# Patient Record
Sex: Female | Born: 1965 | Race: White | Hispanic: No | Marital: Married | Smoking: Never smoker
Health system: Southern US, Community
[De-identification: ages and names within clinical notes are randomized; demographics above are authoritative.]

## PROBLEM LIST (undated history)

## (undated) DIAGNOSIS — Z9889 Other specified postprocedural states: Secondary | ICD-10-CM

## (undated) DIAGNOSIS — Q431 Hirschsprung's disease: Secondary | ICD-10-CM

## (undated) DIAGNOSIS — Z87442 Personal history of urinary calculi: Secondary | ICD-10-CM

## (undated) DIAGNOSIS — M810 Age-related osteoporosis without current pathological fracture: Secondary | ICD-10-CM

## (undated) DIAGNOSIS — G709 Myoneural disorder, unspecified: Secondary | ICD-10-CM

## (undated) HISTORY — PX: SALPINGECTOMY: SHX328

## (undated) HISTORY — PX: SKIN GRAFT SPLIT THICKNESS LEG / FOOT: SUR1303

## (undated) HISTORY — PX: KNEE SURGERY: SHX244

## (undated) HISTORY — PX: MYOMECTOMY: SHX85

---

## 1997-04-18 ENCOUNTER — Other Ambulatory Visit: Admission: RE | Admit: 1997-04-18 | Discharge: 1997-04-18 | Payer: Self-pay | Admitting: Obstetrics and Gynecology

## 1997-07-07 ENCOUNTER — Other Ambulatory Visit: Admission: RE | Admit: 1997-07-07 | Discharge: 1997-07-07 | Payer: Self-pay | Admitting: Obstetrics and Gynecology

## 1997-11-21 ENCOUNTER — Inpatient Hospital Stay (HOSPITAL_COMMUNITY): Admission: RE | Admit: 1997-11-21 | Discharge: 1997-11-24 | Payer: Self-pay | Admitting: Obstetrics and Gynecology

## 1998-05-16 ENCOUNTER — Other Ambulatory Visit: Admission: RE | Admit: 1998-05-16 | Discharge: 1998-05-16 | Payer: Self-pay | Admitting: Obstetrics and Gynecology

## 1999-06-22 ENCOUNTER — Other Ambulatory Visit: Admission: RE | Admit: 1999-06-22 | Discharge: 1999-06-22 | Payer: Self-pay | Admitting: Obstetrics and Gynecology

## 2000-11-04 ENCOUNTER — Other Ambulatory Visit: Admission: RE | Admit: 2000-11-04 | Discharge: 2000-11-04 | Payer: Self-pay | Admitting: Obstetrics and Gynecology

## 2001-11-30 ENCOUNTER — Other Ambulatory Visit: Admission: RE | Admit: 2001-11-30 | Discharge: 2001-11-30 | Payer: Self-pay | Admitting: Obstetrics and Gynecology

## 2003-03-31 ENCOUNTER — Other Ambulatory Visit: Admission: RE | Admit: 2003-03-31 | Discharge: 2003-03-31 | Payer: Self-pay | Admitting: Obstetrics and Gynecology

## 2004-06-01 ENCOUNTER — Other Ambulatory Visit: Admission: RE | Admit: 2004-06-01 | Discharge: 2004-06-01 | Payer: Self-pay | Admitting: Obstetrics and Gynecology

## 2006-02-21 ENCOUNTER — Other Ambulatory Visit: Admission: RE | Admit: 2006-02-21 | Discharge: 2006-02-21 | Payer: Self-pay | Admitting: Obstetrics and Gynecology

## 2006-03-19 ENCOUNTER — Ambulatory Visit (HOSPITAL_COMMUNITY): Admission: RE | Admit: 2006-03-19 | Discharge: 2006-03-19 | Payer: Self-pay | Admitting: Obstetrics and Gynecology

## 2012-03-16 ENCOUNTER — Ambulatory Visit: Payer: Self-pay | Admitting: Internal Medicine

## 2014-11-28 ENCOUNTER — Encounter: Payer: Self-pay | Admitting: Speech Pathology

## 2014-11-28 ENCOUNTER — Ambulatory Visit: Payer: Federal, State, Local not specified - PPO | Attending: Unknown Physician Specialty | Admitting: Speech Pathology

## 2014-11-28 DIAGNOSIS — R490 Dysphonia: Secondary | ICD-10-CM | POA: Diagnosis not present

## 2014-11-29 NOTE — Therapy (Signed)
[place] MAIN [facility] SERVICES [address] [place], [place], [address] Phone: [phone]   Fax:  [phone]  Speech Language Pathology Evaluation  Patient Details  Name: Carol [name] MRN: [id number] Date of Birth: August 28, 1965 Referring Provider: Beverly Gust, MD  Encounter Date: 11/28/2014      End of Session - 11/28/14 1443    Visit Number 1   Number of Visits 5   Date for SLP Re-Evaluation 12/30/14   SLP Start Time 1400   SLP Stop Time  1443   SLP Time Calculation (min) 43 min   Activity Tolerance Patient tolerated treatment well      History reviewed. No pertinent past medical history.  History reviewed. No pertinent past surgical history.  There were no vitals filed for this visit.  Visit Diagnosis: Dysphonia - Plan: SLP plan of care cert/re-cert    Perceptual Voice Evaluation  Voice history: 49 year old woman with painful voicing X1 month.  The patient reports that she has sharp pain with speaking.  With questioning, it appears that the pain occurs when she is speaking loudly and/or with lengthy speaking.  Voice checklist:  Health risks: none identified  Characteristic voice use: sports yelling, has children  Environmental risks: none identified  Misuse: speaks excessively at times, increased strain at times  Abuse: frequently clears her throat; screams/yells; loud sports spectator  Vocal characteristics: strains to project voice  Patient Quality of Life Survey: Voice Handicap Index-10 Score of 0  A score of 10 or higher indicates perceived handicap  Maximum phonation time for sustained ah: 12 seconds  Mean intensity during sustained ah: 73 dB  Mean intensity sustained during conversational speech: 71 dB  Average fundamental frequency during sustained ah:  212 Hz  Average time patient was able to sustain /s/: 14.5  Average time patient was able to sustain /z/: 19  s/z ratio :  0.8  Highest dynamic pitch when altering pitch from a low note to a high note: 321 Hz  Highest pitch during conversational speech: 375 Hz  Lowest dynamic pitch when altering from a high note to a low note: 146 Hz  Lowest pitch during conversational speech: 95 Hz  Visi-Pitch: Multi-Dimensional Voice Program (MDVP)  MDVP extracts objective quantitative values (Relative Average Perturbation, Shimmer, Voice Turbulence Index, and Noise to Harmonic Ratio) on sustained phonation, which are displayed graphically and numerically in comparison to a built-in normative database.  The patient exhibited values within the norm for Relative Average Perturbation, Shimmer, Voice Turbulence Index, and Noise to Harmonic Ratio.  Average fundamental frequency was 1.2 STD below the average for age and gender.   Stimulability:  Patient able to generate painful voicing on command, indicating she will be able to identify when she is misusing her voice and causing the pain.         SLP Education - 11/28/14 1442    Education provided Yes   Education Details Neck, tongue, and throat stretches, negative practice   Person(s) Educated Patient   Methods Explanation;Demonstration;Handout   Comprehension Verbalized understanding;Returned demonstration;Need further instruction            SLP Long Term Goals - 11/28/14 1512    SLP LONG TERM GOAL #1   Title The patient will demonstrate independent understanding of vocal hygiene concepts and neck, shoulder, lingual stretching exercises.   Time 4   Period Weeks   Status New   SLP LONG TERM GOAL #2   Title The patient will minimize  pain and maximize voice quality and loudness using breath support for paragraph length recitation with 80% accuracy.   Time 4   Period Weeks   Status New          Plan - 11/29/14 1551    Clinical Impression Statement This 49 year old woman under the care of Dr. Tami Ribas, with painful voicing, is presenting with moderate dysphonia.   The patient demonstrates hoarse vocal quality, reduced breath control for speech, strained/tense phonation, limited pitch range, vocal fatigue, and laryngeal tension. She will benefit from voice therapy for education, to improve breath support, improve tone focus, promote easy flow phonation, and learn techniques to increase loudness and pitch range without strain or pain.   Speech Therapy Frequency 1x /week   Duration 4 weeks   Potential to Achieve Goals Good   Potential Considerations Ability to learn/carryover information;Cooperation/participation level;Pain level;Previous level of function;Severity of impairments;Family/community support;Other (comment)  Stimulability   SLP Home Exercise Plan To be determined   Consulted and Agree with Plan of Care Patient        Problem List There are no active problems to display for this patient.  Leroy Sea, MS/CCC- SLP  Lou Miner 11/29/2014, 3:56 PM  [place] MAIN [facility] SERVICES [address] [place], [place], [address] Phone: [phone]   Fax:  [phone]  Name: Carol [name] MRN: [id number] Date of Birth: 1965/09/12

## 2014-12-01 ENCOUNTER — Ambulatory Visit: Payer: Federal, State, Local not specified - PPO | Admitting: Speech Pathology

## 2014-12-05 ENCOUNTER — Ambulatory Visit: Payer: Federal, State, Local not specified - PPO | Admitting: Speech Pathology

## 2014-12-05 DIAGNOSIS — R490 Dysphonia: Secondary | ICD-10-CM | POA: Diagnosis not present

## 2014-12-06 ENCOUNTER — Encounter: Payer: Self-pay | Admitting: Speech Pathology

## 2014-12-06 NOTE — Therapy (Signed)
[place] MAIN [facility] SERVICES [address] [place], [place], [address] Phone: [phone]   Fax:  [phone]  Speech Language Pathology Treatment  Patient Details  Name: Carol [name] MRN: [id number] Date of Birth: 11/01/1965 Referring Provider: Beverly Gust, MD  Encounter Date: 12/05/2014      End of Session - 12/06/14 [id number]    Visit Number 2   Number of Visits 5   Date for SLP Re-Evaluation 12/30/14   SLP Start Time 1600   SLP Stop Time  [id number]   SLP Time Calculation (min) 45 min   Activity Tolerance Patient tolerated treatment well      History reviewed. No pertinent past medical history.  History reviewed. No pertinent past surgical history.  There were no vitals filed for this visit.  Visit Diagnosis: Dysphonia      Subjective Assessment - 12/06/14 0923    Subjective The patient reports decreased pain with phonation.     Currently in Pain? Yes   Pain Score --  States pain is lessened and she has better control               ADULT SLP TREATMENT - 12/06/14 0001    General Information   Behavior/Cognition Alert;Cooperative;Pleasant mood   HPI Painful phonation   Treatment Provided   Treatment provided Cognitive-Linquistic   Pain Assessment   Pain Assessment --  Reduced painful phonation   Cognitive-Linquistic Treatment   Treatment focused on Voice   Skilled Treatment The patient was provided with written and verbal teaching regarding neck, tongue, and throat stretches to promote relaxed phonation and reduce painful phonation.  The patient was provided with written and verbal teaching regarding breath support exercises.  Patient demonstrates accurate execution of exercises.  Patient instructed in relaxed phonation / oral resonance. Patient is able to generate clear vocal quality with focus on vocal loudness.  Generate loud (80 dB), good quality phonation for sustained vowel and no pain.  Read aloud short  paragraphs with loud (74 dB) and no pain with 70% accuracy.  Patient able to state that she feels the pain start and knows she needs to breathe.   Assessment / Recommendations / Plan   Plan Continue with current plan of care   Progression Toward Goals   Progression toward goals Progressing toward goals          SLP Education - 12/06/14 0924    Education provided Yes   Education Details Neck, tongue, and throat stretches; breath support exercises; vocal loudness   Person(s) Educated Patient   Methods Explanation;Demonstration   Comprehension Verbalized understanding;Returned demonstration;Verbal cues required;Need further instruction            SLP Long Term Goals - 11/28/14 1512    SLP LONG TERM GOAL #1   Title The patient will demonstrate independent understanding of vocal hygiene concepts and neck, shoulder, lingual stretching exercises.   Time 4   Period Weeks   Status New   SLP LONG TERM GOAL #2   Title The patient will minimize pain and maximize voice quality and loudness using breath support for paragraph length recitation with 80% accuracy.   Time 4   Period Weeks   Status New          Plan - 12/06/14 [id number]    Clinical Impression Statement The patient is able to generate a clear vocal quality and reduced pain with phonation vocal loudness and breath support.  She is independently identifying onset of pain  and means to reduce/eliminate the pain.     Speech Therapy Frequency 1x /week   Duration 4 weeks   Potential to Achieve Goals Good   Potential Considerations Ability to learn/carryover information;Cooperation/participation level;Pain level;Previous level of function;Severity of impairments;Family/community support;Other (comment)   SLP Home Exercise Plan Neck, tongue, and throat stretches; breath support exercises; vocal loudness   Consulted and Agree with Plan of Care Patient        Problem List There are no active problems to display for this  patient.  Leroy Sea, MS/CCC- SLP  Lou Miner 12/06/2014, 9:26 AM  [place] MAIN [facility] SERVICES [address] [place], [place], [address] Phone: [phone]   Fax:  [phone]   Name: Carol [name] MRN: [id number] Date of Birth: 11-11-1965

## 2014-12-12 ENCOUNTER — Ambulatory Visit: Payer: Federal, State, Local not specified - PPO | Admitting: Speech Pathology

## 2014-12-19 ENCOUNTER — Ambulatory Visit: Payer: Federal, State, Local not specified - PPO | Admitting: Speech Pathology

## 2014-12-19 DIAGNOSIS — R490 Dysphonia: Secondary | ICD-10-CM

## 2014-12-19 NOTE — Therapy (Signed)
[place] MAIN [facility] SERVICES [address] [place], [place], [address] Phone: [phone]   Fax:  [phone]  Speech Language Pathology Treatment  Patient Details  Name: Carol [name] MRN: [id number] Date of Birth: [birth year] Referring Provider: Beverly Gust, MD  Encounter Date: 12/19/2014      End of Session - 12/19/14 1444    Visit Number 3   Number of Visits 5   Date for SLP Re-Evaluation 12/30/14   SLP Start Time 1355   SLP Stop Time  1435   SLP Time Calculation (min) 40 min   Activity Tolerance Patient tolerated treatment well      No past medical history on file.  No past surgical history on file.  There were no vitals filed for this visit.  Visit Diagnosis: No diagnosis found.      Subjective Assessment - 12/19/14 1442    Subjective The patient reports pain is not noted in relaxed environments such as vacation, but in stressful ones such as work, it has a greater presence.   Currently in Pain? Yes               ADULT SLP TREATMENT - 12/19/14 0001    General Information   Behavior/Cognition Alert;Cooperative;Pleasant mood   HPI Painful phonation   Treatment Provided   Treatment provided Cognitive-Linquistic   Cognitive-Linquistic Treatment   Treatment focused on Voice   Skilled Treatment The patient read passages for 20 minutes utilizing her own strategies for keeping the vocal tract relaxed and taking a breath when she felt pain. Two large breaths were noted as preventative measures when feeling tight and one breath as a result of pain as reported by the patient. The patient used easy onsets to generate loud and clear vowel sounds with 80% accuracy and was able to fade easy onset for /a/ and /e/ vowels.    Assessment / Recommendations / Plan   Plan Continue with current plan of care   Progression Toward Goals   Progression toward goals Progressing toward goals          SLP Education -  12/19/14 1443    Education Details Easy onset techniques for sustained vowel and vocal loudness   Person(s) Educated Patient   Methods Explanation;Demonstration   Comprehension Verbalized understanding;Returned demonstration;Verbal cues required;Need further instruction            SLP Long Term Goals - 11/28/14 1512    SLP LONG TERM GOAL #1   Title The patient will demonstrate independent understanding of vocal hygiene concepts and neck, shoulder, lingual stretching exercises.   Time 4   Period Weeks   Status New   SLP LONG TERM GOAL #2   Title The patient will minimize pain and maximize voice quality and loudness using breath support for paragraph length recitation with 80% accuracy.   Time 4   Period Weeks   Status New          Plan - 12/19/14 1445    Clinical Impression Statement The patient is able to generate a clear vocal quality and reduced pain with phonation vocal loudness and breath support.  She is independently identifying onset of pain and means to reduce/eliminate the pain.  For loud sustained vowels, she is able to use easy onset technique to attain better flow phonation and oral resonance; she is beginning to be able to fade easy onset technique to get the same quality.    Speech Therapy Frequency 1x /week  Duration 4 weeks   Potential to Achieve Goals Good   Potential Considerations Ability to learn/carryover information;Cooperation/participation level;Pain level;Previous level of function;Severity of impairments;Family/community support;Other (comment)   SLP Home Exercise Plan Neck, tongue, and throat stretches; breath support exercises; vocal loudness   Consulted and Agree with Plan of Care Patient        Problem List There are no active problems to display for this patient.   Rocco Serene 12/19/2014, 2:49 PM  [place] MAIN [facility] SERVICES [address] [place], [place], [address] Phone: [phone]   Fax:   [phone]   Name: Carol [name] MRN: [id number] Date of Birth: Oct 12, 1965

## 2014-12-26 ENCOUNTER — Ambulatory Visit: Payer: Federal, State, Local not specified - PPO | Attending: Unknown Physician Specialty | Admitting: Speech Pathology

## 2014-12-26 DIAGNOSIS — R490 Dysphonia: Secondary | ICD-10-CM | POA: Diagnosis present

## 2014-12-26 NOTE — Therapy (Signed)
[place] MAIN [facility] SERVICES [address] [place], [place], [address] Phone: [phone]   Fax:  [phone]  Speech Language Pathology Treatment/Discharge Summary  Patient Details  Name: Carol [name] MRN: [id number] Date of Birth: [birth year] Referring Provider: Beverly Gust, MD  Encounter Date: 12/26/2014      End of Session - 12/26/14 1348    Visit Number 4   Number of Visits 5   Date for SLP Re-Evaluation 12/30/14   SLP Start Time 1300   SLP Stop Time  1343   SLP Time Calculation (min) 43 min   Activity Tolerance Patient tolerated treatment well      No past medical history on file.  No past surgical history on file.  There were no vitals filed for this visit.  Visit Diagnosis: Dysphonia      Subjective Assessment - 12/26/14 1345    Subjective The patient reports that she has had no instances of painful phonation for almost a week.   Currently in Pain? No/denies               ADULT SLP TREATMENT - 12/26/14 0001    General Information   Behavior/Cognition Alert;Cooperative;Pleasant mood   HPI Painful phonation   Treatment Provided   Treatment provided Cognitive-Linquistic   Cognitive-Linquistic Treatment   Treatment focused on Voice   Skilled Treatment Patient generated loud and clear voice for pitch glides, sustained vowels, recitation, and reading with 100% accuracy. Patient had 80% accuracy for spontaneous responses. Patient had no episodes of painful phonation.   Assessment / Recommendations / Plan   Plan Discharge SLP treatment due to (comment);All goals met   Progression Toward Goals   Progression toward goals Goals met, education completed, patient discharged from SLP          SLP Education - 12/26/14 1346    Education provided Yes   Education Details Pitch glides; paying attention to tension in facial muscles as it could be that her tension in the larynx is happening at similar times    Person(s) Educated Patient   Methods Explanation;Demonstration   Comprehension Verbalized understanding;Returned demonstration            SLP Long Term Goals - 12/26/14 1353    SLP LONG TERM GOAL #1   Title The patient will demonstrate independent understanding of vocal hygiene concepts and neck, shoulder, lingual stretching exercises.   Time 4   Period Weeks   Status Achieved   SLP LONG TERM GOAL #2   Title The patient will minimize pain and maximize voice quality and loudness using breath support for paragraph length recitation with 80% accuracy.   Time 4   Period Weeks   Status Achieved          Plan - 12/26/14 1348    Clinical Impression Statement The patient is able to generate a clear vocal quality and reduced pain with phonation vocal loudness and breath support.  She is independently identifying onset of pain and means to reduce/eliminate the pain.  For loud sustained vowels, she is able to better utilize flow phonation and oral resonance. The patient is feeling confident about her ability to implement strategies from treatment and has met all goals. She will be discharged from speech services.   Speech Therapy Frequency 1x /week   Duration 4 weeks   Potential to Achieve Goals Good   Potential Considerations Ability to learn/carryover information;Cooperation/participation level;Pain level;Previous level of function;Severity of impairments;Family/community support;Other (comment)   SLP  Home Exercise Plan Neck, tongue, and throat stretches; breath support exercises; vocal loudness   Consulted and Agree with Plan of Care Patient        Problem List There are no active problems to display for this patient.   Rocco Serene 12/26/2014, 2:58 PM  [place] MAIN [facility] SERVICES [address] [place], [place], [address] Phone: [phone]   Fax:  [phone]   Name: Carol [name] MRN: [id number] Date of Birth:  1965/11/16

## 2014-12-29 ENCOUNTER — Other Ambulatory Visit: Payer: Self-pay | Admitting: Internal Medicine

## 2014-12-29 DIAGNOSIS — Z1231 Encounter for screening mammogram for malignant neoplasm of breast: Secondary | ICD-10-CM

## 2015-01-04 ENCOUNTER — Ambulatory Visit
Admission: RE | Admit: 2015-01-04 | Discharge: 2015-01-04 | Disposition: A | Discharge status: Home / Self Care | Payer: Federal, State, Local not specified - PPO | Source: Ambulatory Visit | Attending: Internal Medicine | Admitting: Internal Medicine

## 2015-01-04 DIAGNOSIS — Z1231 Encounter for screening mammogram for malignant neoplasm of breast: Secondary | ICD-10-CM

## 2015-05-25 ENCOUNTER — Encounter: Payer: Self-pay | Admitting: *Deleted

## 2015-05-26 ENCOUNTER — Encounter
Admission: RE | Disposition: A | Discharge status: Home / Self Care | Payer: Self-pay | Source: Ambulatory Visit | Attending: Unknown Physician Specialty

## 2015-05-26 ENCOUNTER — Ambulatory Visit: Payer: Federal, State, Local not specified - PPO | Admitting: Anesthesiology

## 2015-05-26 ENCOUNTER — Ambulatory Visit
Admission: RE | Admit: 2015-05-26 | Discharge: 2015-05-26 | Disposition: A | Discharge status: Home / Self Care | Payer: Federal, State, Local not specified - PPO | Source: Ambulatory Visit | Attending: Unknown Physician Specialty | Admitting: Unknown Physician Specialty

## 2015-05-26 ENCOUNTER — Encounter: Payer: Self-pay | Admitting: *Deleted

## 2015-05-26 DIAGNOSIS — Z803 Family history of malignant neoplasm of breast: Secondary | ICD-10-CM | POA: Insufficient documentation

## 2015-05-26 DIAGNOSIS — K648 Other hemorrhoids: Secondary | ICD-10-CM | POA: Insufficient documentation

## 2015-05-26 DIAGNOSIS — Z1211 Encounter for screening for malignant neoplasm of colon: Secondary | ICD-10-CM | POA: Diagnosis present

## 2015-05-26 DIAGNOSIS — K640 First degree hemorrhoids: Secondary | ICD-10-CM | POA: Insufficient documentation

## 2015-05-26 DIAGNOSIS — D122 Benign neoplasm of ascending colon: Secondary | ICD-10-CM | POA: Diagnosis not present

## 2015-05-26 DIAGNOSIS — D123 Benign neoplasm of transverse colon: Secondary | ICD-10-CM | POA: Insufficient documentation

## 2015-05-26 HISTORY — DX: Myoneural disorder, unspecified: G70.9

## 2015-05-26 HISTORY — PX: COLONOSCOPY WITH PROPOFOL: SHX5780

## 2015-05-26 HISTORY — DX: Hirschsprung's disease: Q43.1

## 2015-05-26 HISTORY — DX: Age-related osteoporosis without current pathological fracture: M81.0

## 2015-05-26 SURGERY — COLONOSCOPY WITH PROPOFOL
Anesthesia: General

## 2015-05-26 MED ORDER — PROPOFOL 10 MG/ML IV BOLUS
INTRAVENOUS | Status: DC | PRN
Start: 2015-05-26 — End: 2015-05-26
  Administered 2015-05-26: 50 mg via INTRAVENOUS
  Administered 2015-05-26: 20 mg via INTRAVENOUS

## 2015-05-26 MED ORDER — FENTANYL CITRATE (PF) 100 MCG/2ML IJ SOLN
INTRAMUSCULAR | Status: DC | PRN
Start: 2015-05-26 — End: 2015-05-26
  Administered 2015-05-26: 50 ug via INTRAVENOUS

## 2015-05-26 MED ORDER — MIDAZOLAM HCL 5 MG/5ML IJ SOLN
INTRAMUSCULAR | Status: DC | PRN
  Administered 2015-05-26: 1 mg via INTRAVENOUS

## 2015-05-26 MED ORDER — SODIUM CHLORIDE 0.9 % IV SOLN: INTRAVENOUS | Status: DC

## 2015-05-26 MED ORDER — LIDOCAINE HCL (PF) 2 % IJ SOLN
INTRAMUSCULAR | Status: DC | PRN
  Administered 2015-05-26: 50 mg

## 2015-05-26 MED ORDER — SODIUM CHLORIDE 0.9 % IV SOLN
INTRAVENOUS | Status: DC
  Administered 2015-05-26: 13:00:00 via INTRAVENOUS

## 2015-05-26 MED ORDER — PROPOFOL 500 MG/50ML IV EMUL
INTRAVENOUS | Status: DC | PRN
  Administered 2015-05-26: 100 ug/kg/min via INTRAVENOUS

## 2015-05-26 MED ORDER — EPHEDRINE SULFATE 50 MG/ML IJ SOLN
INTRAMUSCULAR | Status: DC | PRN
Start: 2015-05-26 — End: 2015-05-26
  Administered 2015-05-26: 5 mg via INTRAVENOUS
  Administered 2015-05-26 (×2): 10 mg via INTRAVENOUS
  Administered 2015-05-26 (×2): 5 mg via INTRAVENOUS
  Administered 2015-05-26: 10 mg via INTRAVENOUS

## 2015-05-26 NOTE — Anesthesia Preprocedure Evaluation (Signed)
Anesthesia Evaluation  Patient identified by MRN, date of birth, ID band Patient awake    Reviewed: Allergy & Precautions, H&P , NPO status , Patient's Chart, lab work & pertinent test results  Airway Mallampati: II  TM Distance: >3 FB Neck ROM: full    Dental  (+) Poor Dentition, Chipped   Pulmonary neg pulmonary ROS, neg shortness of breath,    Pulmonary exam normal breath sounds clear to auscultation       Cardiovascular Exercise Tolerance: Good (-) angina+ DOE  (-) Past MI negative cardio ROS Normal cardiovascular exam Rhythm:regular Rate:Normal     Neuro/Psych  Neuromuscular disease negative psych ROS   GI/Hepatic negative GI ROS, Neg liver ROS, neg GERD  ,  Endo/Other  negative endocrine ROS  Renal/GU negative Renal ROS  negative genitourinary   Musculoskeletal   Abdominal   Peds  Hematology negative hematology ROS (+)   Anesthesia Other Findings Past Medical History:   Osteoporosis                                                 Neuromuscular disorder ([place])                                 Hirschsprung's disease                                      Past Surgical History:   KNEE SURGERY                                                  MYOMECTOMY                                                    SALPINGECTOMY                                   Left             BMI    Body Mass Index   22.70 kg/m 2      Reproductive/Obstetrics negative OB ROS                             Anesthesia Physical Anesthesia Plan  ASA: III  Anesthesia Plan: General   Post-op Pain Management:    Induction:   Airway Management Planned:   Additional Equipment:   Intra-op Plan:   Post-operative Plan:   Informed Consent: I have reviewed the patients History and Physical, chart, labs and discussed the procedure including the risks, benefits and alternatives for the proposed anesthesia with the  patient or authorized representative who has indicated his/her understanding and acceptance.   Dental Advisory Given  Plan Discussed with: Anesthesiologist, CRNA and Surgeon  Anesthesia Plan Comments:         Anesthesia Quick Evaluation

## 2015-05-26 NOTE — Transfer of Care (Signed)
Immediate Anesthesia Transfer of Care Note  Patient: Carol [name]  Procedure(s) Performed: Procedure(s): COLONOSCOPY WITH PROPOFOL (N/A)  Patient Location: PACU  Anesthesia Type:General  Level of Consciousness: awake  Airway & Oxygen Therapy: Patient Spontanous Breathing  Post-op Assessment: Report given to RN and Post -op Vital signs reviewed and stable  Post vital signs: Reviewed and stable  Last Vitals:  Filed Vitals:   05/26/15 1254  BP: 96/70  Pulse: 59  Temp: 36.3 C  Resp: 18    Last Pain: There were no vitals filed for this visit.       Complications: No apparent anesthesia complications

## 2015-05-26 NOTE — Op Note (Signed)
[facility] Patient Name: Carol [name] Procedure Date: 05/26/2015 1:15 PM MRN: [id number] Account #: [id number] Date of Birth: April 25, 1965 Admit Type: Outpatient Age: 50 Room: [facility] ENDO ROOM 1 Gender: Female Note Status: Finalized Procedure:            Colonoscopy Indications:          Screening for colorectal malignant neoplasm Providers:            Manya Silvas, MD Referring MD:         Ocie Cornfield. Ouida Sills, MD (Referring MD) Medicines:            Propofol per Anesthesia Complications:        No immediate complications. Procedure:            Pre-Anesthesia Assessment:                       - After reviewing the risks and benefits, the patient                        was deemed in satisfactory condition to undergo the                        procedure.                       After obtaining informed consent, the colonoscope was                        passed under direct vision. Throughout the procedure,                        the patient's blood pressure, pulse, and oxygen                        saturations were monitored continuously. The                        Colonoscope was introduced through the anus and                        advanced to the the cecum, identified by appendiceal                        orifice and ileocecal valve. The colonoscopy was                        performed without difficulty. The patient tolerated the                        procedure well. The quality of the bowel preparation                        was excellent. Findings:      A diminutive polyp was found in the ascending colon. The polyp was       sessile. The polyp was removed with a jumbo cold forceps. Resection and       retrieval were complete.      A diminutive polyp was found in the transverse colon. The polyp was       sessile. The polyp was removed with a jumbo cold forceps. Resection and  retrieval were complete.      Internal hemorrhoids  were found during endoscopy. The hemorrhoids were       small and Grade I (internal hemorrhoids that do not prolapse).      The exam was otherwise without abnormality. Impression:           - One diminutive polyp in the ascending colon, removed                        with a jumbo cold forceps. Resected and retrieved.                       - One diminutive polyp in the transverse colon, removed                        with a jumbo cold forceps. Resected and retrieved.                       - Internal hemorrhoids.                       - The examination was otherwise normal. Recommendation:       - Await pathology results. Manya Silvas, MD 05/26/2015 1:49:45 PM This report has been signed electronically. Number of Addenda: 0 Note Initiated On: 05/26/2015 1:15 PM Scope Withdrawal Time: 0 hours 11 minutes 36 seconds  Total Procedure Duration: 0 hours 16 minutes 25 seconds       [facility]

## 2015-05-26 NOTE — H&P (Signed)
   Primary Care Physician:  Kirk Ruths., MD Primary Gastroenterologist:  Dr. Vira Agar  Pre-Procedure History & Physical: HPI:  Carol [name] is a 50 y.o. female is here for an colonoscopy.   Past Medical History  Diagnosis Date  . Osteoporosis   . Neuromuscular disorder ([place])   . Hirschsprung's disease     Past Surgical History  Procedure Laterality Date  . Knee surgery    . Myomectomy    . Salpingectomy Left     Prior to Admission medications   Medication Sig Start Date End Date Taking? Authorizing Provider  alendronate-cholecalciferol (FOSAMAX PLUS D) 70-2800 MG-UNIT tablet Take 1 tablet by mouth every 7 (seven) days. Take with a full glass of water on an empty stomach.   Yes Historical Provider, MD  calcium carbonate 1250 MG capsule Take 1,250 mg by mouth 2 (two) times daily with a meal.   Yes Historical Provider, MD  Cholecalciferol 1000 units tablet Take 2,000 Units by mouth daily.   Yes Historical Provider, MD    Allergies as of 03/28/2015 - Review Complete 12/26/2014  Allergen Reaction Noted  . Morphine and related  11/28/2014    Family History  Problem Relation Age of Onset  . Breast cancer Mother 69    Social History   Social History  . Marital Status: Married    Spouse Name: N/A  . Number of Children: N/A  . Years of Education: N/A   Occupational History  . Not on file.   Social History Main Topics  . Smoking status: Never Smoker   . Smokeless tobacco: Not on file  . Alcohol Use: No  . Drug Use: No  . Sexual Activity: Not on file   Other Topics Concern  . Not on file   Social History Narrative    Review of Systems: See HPI, otherwise negative ROS  Physical Exam: BP 96/70 mmHg  Pulse 59  Temp(Src) 97.3 F (36.3 C) (Tympanic)  Resp 18  Ht 5\' 7"  (1.702 m)  Wt 65.772 kg (145 lb)  BMI 22.71 kg/m2  SpO2 98% General:   Alert,  pleasant and cooperative in NAD Head:  Normocephalic and atraumatic. Neck:  Supple; no masses or  thyromegaly. Lungs:  Clear throughout to auscultation.    Heart:  Regular rate and rhythm. Abdomen:  Soft, nontender and nondistended. Normal bowel sounds, without guarding, and without rebound.   Neurologic:  Alert and  oriented x4;  grossly normal neurologically.  Impression/Plan: Carol [name] is here for an colonoscopy to be performed for screening colonoscopy  Risks, benefits, limitations, and alternatives regarding  colonoscopy have been reviewed with the patient.  Questions have been answered.  All parties agreeable.   Gaylyn Cheers, MD  05/26/2015, 1:20 PM

## 2015-05-26 NOTE — Anesthesia Postprocedure Evaluation (Signed)
Anesthesia Post Note  Patient: Carol [name]  Procedure(s) Performed: Procedure(s) (LRB): COLONOSCOPY WITH PROPOFOL (N/A)  Anesthesia Post Evaluation  Last Vitals:  Filed Vitals:   05/26/15 1401 05/26/15 1411  BP: 107/74 96/61  Pulse: 71 65  Temp:    Resp: 17 16    Last Pain: There were no vitals filed for this visit.               Precious Haws Ivet Guerrieri

## 2015-05-28 ENCOUNTER — Encounter: Payer: Self-pay | Admitting: Unknown Physician Specialty

## 2015-05-29 LAB — SURGICAL PATHOLOGY

## 2015-07-17 ENCOUNTER — Emergency Department
Admission: EM | Admit: 2015-07-17 | Discharge: 2015-07-17 | Disposition: A | Discharge status: Home / Self Care | Payer: Federal, State, Local not specified - PPO | Attending: Emergency Medicine | Admitting: Emergency Medicine

## 2015-07-17 ENCOUNTER — Encounter: Payer: Self-pay | Admitting: Emergency Medicine

## 2015-07-17 DIAGNOSIS — Y939 Activity, unspecified: Secondary | ICD-10-CM | POA: Insufficient documentation

## 2015-07-17 DIAGNOSIS — T24031A Burn of unspecified degree of right lower leg, initial encounter: Secondary | ICD-10-CM | POA: Diagnosis present

## 2015-07-17 DIAGNOSIS — T24202A Burn of second degree of unspecified site of left lower limb, except ankle and foot, initial encounter: Secondary | ICD-10-CM

## 2015-07-17 DIAGNOSIS — W401XXA Explosion of explosive gases, initial encounter: Secondary | ICD-10-CM | POA: Insufficient documentation

## 2015-07-17 DIAGNOSIS — T24231A Burn of second degree of right lower leg, initial encounter: Secondary | ICD-10-CM | POA: Diagnosis not present

## 2015-07-17 DIAGNOSIS — T24232A Burn of second degree of left lower leg, initial encounter: Secondary | ICD-10-CM | POA: Insufficient documentation

## 2015-07-17 DIAGNOSIS — T24201A Burn of second degree of unspecified site of right lower limb, except ankle and foot, initial encounter: Secondary | ICD-10-CM

## 2015-07-17 DIAGNOSIS — Y999 Unspecified external cause status: Secondary | ICD-10-CM | POA: Diagnosis not present

## 2015-07-17 DIAGNOSIS — Y929 Unspecified place or not applicable: Secondary | ICD-10-CM | POA: Diagnosis not present
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
## 2015-07-17 NOTE — Discharge Instructions (Signed)
Burn Care °Your skin is a natural barrier to infection. It is the largest organ of your body. Burns damage this natural protection. To help prevent infection, it is very important to follow your caregiver's instructions in the care of your burn. °Burns are classified as: °· First degree. There is only redness of the skin (erythema). No scarring is expected. °· Second degree. There is blistering of the skin. Scarring may occur with deeper burns. °· Third degree. All layers of the skin are injured, and scarring is expected. °HOME CARE INSTRUCTIONS  °· Wash your hands well before changing your bandage. °· Change your bandage as often as directed by your caregiver. °¨ Remove the old bandage. If the bandage sticks, you may soak it off with cool, clean water. °¨ Cleanse the burn thoroughly but gently with mild soap and water. °¨ Pat the area dry with a clean, dry cloth. °¨ Apply a thin layer of antibacterial cream to the burn. °¨ Apply a clean bandage as instructed by your caregiver. °¨ Keep the bandage as clean and dry as possible. °· Elevate the affected area for the first 24 hours, then as instructed by your caregiver. °· Only take over-the-counter or prescription medicines for pain, discomfort, or fever as directed by your caregiver. °SEEK IMMEDIATE MEDICAL CARE IF:  °· You develop excessive pain. °· You develop redness, tenderness, swelling, or red streaks near the burn. °· The burned area develops yellowish-white fluid (pus) or a bad smell. °· You have a fever. °MAKE SURE YOU:  °· Understand these instructions. °· Will watch your condition. °· Will get help right away if you are not doing well or get worse. °  °This information is not intended to replace advice given to you by your health care provider. Make sure you discuss any questions you have with your health care provider. °  °Document Released: 01/07/2005 Document Revised: 04/01/2011 Document Reviewed: 05/30/2010 °Elsevier Interactive Patient Education ©2016  Elsevier Inc. ° °

## 2016-01-23 ENCOUNTER — Other Ambulatory Visit: Payer: Self-pay | Admitting: Internal Medicine

## 2016-01-23 DIAGNOSIS — Z1231 Encounter for screening mammogram for malignant neoplasm of breast: Secondary | ICD-10-CM

## 2016-02-21 ENCOUNTER — Ambulatory Visit
Admission: RE | Admit: 2016-02-21 | Discharge: 2016-02-21 | Disposition: A | Discharge status: Home / Self Care | Payer: Federal, State, Local not specified - PPO | Source: Ambulatory Visit | Attending: Internal Medicine | Admitting: Internal Medicine

## 2016-02-21 DIAGNOSIS — Z1231 Encounter for screening mammogram for malignant neoplasm of breast: Secondary | ICD-10-CM | POA: Diagnosis not present

## 2017-02-05 ENCOUNTER — Other Ambulatory Visit: Payer: Self-pay | Admitting: Internal Medicine

## 2017-02-05 DIAGNOSIS — Z1231 Encounter for screening mammogram for malignant neoplasm of breast: Secondary | ICD-10-CM

## 2017-02-21 ENCOUNTER — Ambulatory Visit
Admission: RE | Admit: 2017-02-21 | Discharge: 2017-02-21 | Disposition: A | Discharge status: Home / Self Care | Payer: Federal, State, Local not specified - PPO | Source: Ambulatory Visit | Attending: Internal Medicine | Admitting: Internal Medicine

## 2017-02-21 DIAGNOSIS — Z1231 Encounter for screening mammogram for malignant neoplasm of breast: Secondary | ICD-10-CM | POA: Diagnosis present

## 2018-11-25 ENCOUNTER — Encounter
Admission: RE | Admit: 2018-11-25 | Discharge: 2018-11-25 | Disposition: A | Discharge status: Home / Self Care | Payer: Federal, State, Local not specified - PPO | Source: Ambulatory Visit | Attending: Urology | Admitting: Urology

## 2018-11-25 ENCOUNTER — Other Ambulatory Visit: Payer: Self-pay

## 2018-11-25 DIAGNOSIS — Z20828 Contact with and (suspected) exposure to other viral communicable diseases: Secondary | ICD-10-CM | POA: Diagnosis not present

## 2018-11-25 DIAGNOSIS — Z01812 Encounter for preprocedural laboratory examination: Secondary | ICD-10-CM | POA: Insufficient documentation

## 2018-11-25 HISTORY — DX: Personal history of urinary calculi: Z87.442

## 2018-11-25 LAB — SARS CORONAVIRUS 2 (TAT 6-24 HRS): SARS Coronavirus 2: NEGATIVE

## 2018-11-25 MED ORDER — CEFAZOLIN SODIUM-DEXTROSE 1-4 GM/50ML-% IV SOLN
1.0000 g | Freq: Once | INTRAVENOUS | Status: AC
  Administered 2018-11-26: 14:00:00 1 g via INTRAVENOUS

## 2018-11-25 NOTE — Patient Instructions (Signed)
Your procedure is scheduled on: Thursday 11/26/18.  Report to DAY SURGERY DEPARTMENT LOCATED ON 2ND FLOOR MEDICAL MALL ENTRANCE. To find out your arrival time please call [phone] between 1PM - 3PM on  Wednesday 11/25/18.    Remember: Instructions that are not followed completely may result in serious medical risk, up to and including death, or upon the discretion of your surgeon and anesthesiologist your surgery may need to be rescheduled.      _X__ 1. Do not eat food after midnight the night before your procedure.                 No gum chewing or hard candies. You may drink clear liquids up to 2 hours                 before you are scheduled to arrive for your surgery- DO NOT drink clear                 liquids within 2 hours of the start of your surgery.                 Clear Liquids include:  water, apple juice without pulp, clear carbohydrate                 drink such as Clearfast or Gatorade, Black Coffee or Tea (Do not add                 Milk or creamer to coffee or tea).    __X__2.  On the morning of surgery brush your teeth with toothpaste and water, you may rinse your mouth with mouthwash if you wish.  Do not swallow any toothpaste or mouthwash.      __X__3.  Notify your doctor if there is any change in your medical condition      (cold, fever, infections).       Do not wear jewelry, make-up, hairpins, clips or nail polish. Do not wear lotions, powders, or perfumes.  Do not shave 48 hours prior to surgery. Men may shave face and neck. Do not bring valuables to the hospital.      [facility] is not responsible for any belongings or valuables.    Contacts, dentures/partials or body piercings may not be worn into surgery. Bring a case for your contacts, glasses or hearing aids, a denture cup will be supplied.     Patients discharged the day of surgery will not be allowed to drive home.     __X__ Take these medicines the morning of surgery with A SIP OF  WATER:     1. tamsulosin (FLOMAX) 0.4 MG CAPS capsule    __X__ Stop Anti-inflammatories 7 days before surgery such as Advil, Ibuprofen, Motrin, BC or Goodies Powder, Naprosyn, Naproxen, Aleve, Aspirin, Meloxicam. May take Tylenol if needed for pain or discomfort.     __X__ Don't start taking any new herbal supplements before your procedure.

## 2018-11-26 ENCOUNTER — Ambulatory Visit
Admission: RE | Admit: 2018-11-26 | Discharge: 2018-11-26 | Disposition: A | Discharge status: Home / Self Care | Payer: Federal, State, Local not specified - PPO | Attending: Urology | Admitting: Urology

## 2018-11-26 ENCOUNTER — Ambulatory Visit
Admission: RE | Admit: 2018-11-26 | Payer: Federal, State, Local not specified - PPO | Source: Home / Self Care | Admitting: Urology

## 2018-11-26 ENCOUNTER — Encounter
Admission: RE | Disposition: A | Discharge status: Home / Self Care | Payer: Self-pay | Source: Home / Self Care | Attending: Urology

## 2018-11-26 ENCOUNTER — Ambulatory Visit: Payer: Self-pay | Admitting: Urology

## 2018-11-26 ENCOUNTER — Ambulatory Visit: Payer: Federal, State, Local not specified - PPO | Admitting: Anesthesiology

## 2018-11-26 ENCOUNTER — Ambulatory Visit: Payer: Federal, State, Local not specified - PPO

## 2018-11-26 ENCOUNTER — Other Ambulatory Visit: Payer: Self-pay

## 2018-11-26 ENCOUNTER — Encounter: Admission: RE | Payer: Self-pay | Source: Home / Self Care

## 2018-11-26 DIAGNOSIS — Z791 Long term (current) use of non-steroidal anti-inflammatories (NSAID): Secondary | ICD-10-CM | POA: Diagnosis not present

## 2018-11-26 DIAGNOSIS — N132 Hydronephrosis with renal and ureteral calculous obstruction: Secondary | ICD-10-CM | POA: Insufficient documentation

## 2018-11-26 DIAGNOSIS — Z79899 Other long term (current) drug therapy: Secondary | ICD-10-CM | POA: Diagnosis not present

## 2018-11-26 DIAGNOSIS — N201 Calculus of ureter: Secondary | ICD-10-CM | POA: Diagnosis present

## 2018-11-26 DIAGNOSIS — Z79891 Long term (current) use of opiate analgesic: Secondary | ICD-10-CM | POA: Diagnosis not present

## 2018-11-26 DIAGNOSIS — M810 Age-related osteoporosis without current pathological fracture: Secondary | ICD-10-CM | POA: Diagnosis not present

## 2018-11-26 HISTORY — PX: CYSTOSCOPY WITH STENT PLACEMENT: SHX5790

## 2018-11-26 HISTORY — PX: URETEROSCOPY WITH HOLMIUM LASER LITHOTRIPSY: SHX6645

## 2018-11-26 HISTORY — PX: CYSTOSCOPY W/ RETROGRADES: SHX1426

## 2018-11-26 SURGERY — URETEROSCOPY, WITH LITHOTRIPSY USING HOLMIUM LASER
Anesthesia: Choice | Laterality: Left

## 2018-11-26 SURGERY — URETEROSCOPY, WITH LITHOTRIPSY USING HOLMIUM LASER
Anesthesia: General | Site: Ureter | Laterality: Left

## 2018-11-26 MED ORDER — LACTATED RINGERS IV SOLN
INTRAVENOUS | Status: DC
  Administered 2018-11-26: 50 mL/h via INTRAVENOUS
  Administered 2018-11-26: 16:00:00 via INTRAVENOUS

## 2018-11-26 MED ORDER — PROMETHAZINE HCL 25 MG/ML IJ SOLN: 6.2500 mg | INTRAMUSCULAR | Status: DC | PRN

## 2018-11-26 MED ORDER — GLYCOPYRROLATE 0.2 MG/ML IJ SOLN
INTRAMUSCULAR | Status: DC | PRN
  Administered 2018-11-26: 0.2 mg via INTRAVENOUS

## 2018-11-26 MED ORDER — ONDANSETRON 4 MG PO TBDP
ORAL_TABLET | ORAL | Status: AC
  Filled 2018-11-26: qty 1

## 2018-11-26 MED ORDER — FAMOTIDINE 20 MG PO TABS
ORAL_TABLET | ORAL | Status: AC
  Filled 2018-11-26: qty 1

## 2018-11-26 MED ORDER — ONDANSETRON HCL 4 MG/2ML IJ SOLN
INTRAMUSCULAR | Status: DC | PRN
  Administered 2018-11-26: 4 mg via INTRAVENOUS

## 2018-11-26 MED ORDER — MEPERIDINE HCL 50 MG/ML IJ SOLN: 6.2500 mg | INTRAMUSCULAR | Status: DC | PRN

## 2018-11-26 MED ORDER — FAMOTIDINE 20 MG PO TABS
20.0000 mg | ORAL_TABLET | Freq: Once | ORAL | Status: AC
  Administered 2018-11-26: 20 mg via ORAL

## 2018-11-26 MED ORDER — ACETAMINOPHEN 160 MG/5ML PO SOLN
325.0000 mg | ORAL | Status: DC | PRN
  Filled 2018-11-26: qty 20.3

## 2018-11-26 MED ORDER — LIDOCAINE HCL URETHRAL/MUCOSAL 2 % EX GEL
CUTANEOUS | Status: DC | PRN
  Administered 2018-11-26: 1 via URETHRAL

## 2018-11-26 MED ORDER — PROPOFOL 10 MG/ML IV BOLUS
INTRAVENOUS | Status: DC | PRN
  Administered 2018-11-26: 150 mg via INTRAVENOUS
  Administered 2018-11-26: 20 mg via INTRAVENOUS

## 2018-11-26 MED ORDER — KETOROLAC TROMETHAMINE 30 MG/ML IJ SOLN: 30.0000 mg | Freq: Once | INTRAMUSCULAR | Status: DC | PRN

## 2018-11-26 MED ORDER — PROPOFOL 10 MG/ML IV BOLUS
INTRAVENOUS | Status: AC
  Filled 2018-11-26: qty 40

## 2018-11-26 MED ORDER — PHENYLEPHRINE HCL (PRESSORS) 10 MG/ML IV SOLN
INTRAVENOUS | Status: DC | PRN
  Administered 2018-11-26: 50 ug via INTRAVENOUS

## 2018-11-26 MED ORDER — KETOROLAC TROMETHAMINE 30 MG/ML IJ SOLN
INTRAMUSCULAR | Status: AC
  Filled 2018-11-26: qty 1

## 2018-11-26 MED ORDER — BELLADONNA ALKALOIDS-OPIUM 16.2-60 MG RE SUPP
RECTAL | Status: DC | PRN
  Administered 2018-11-26: 1 via RECTAL

## 2018-11-26 MED ORDER — MIDAZOLAM HCL 2 MG/2ML IJ SOLN
INTRAMUSCULAR | Status: AC
  Filled 2018-11-26: qty 2

## 2018-11-26 MED ORDER — FENTANYL CITRATE (PF) 100 MCG/2ML IJ SOLN
INTRAMUSCULAR | Status: AC
  Filled 2018-11-26: qty 2

## 2018-11-26 MED ORDER — EPHEDRINE SULFATE 50 MG/ML IJ SOLN
INTRAMUSCULAR | Status: DC | PRN
  Administered 2018-11-26 (×2): 5 mg via INTRAVENOUS

## 2018-11-26 MED ORDER — FENTANYL CITRATE (PF) 100 MCG/2ML IJ SOLN
INTRAMUSCULAR | Status: DC | PRN
  Administered 2018-11-26: 50 ug via INTRAVENOUS
  Administered 2018-11-26 (×2): 25 ug via INTRAVENOUS

## 2018-11-26 MED ORDER — CEFAZOLIN SODIUM-DEXTROSE 1-4 GM/50ML-% IV SOLN
INTRAVENOUS | Status: AC
  Filled 2018-11-26: qty 50

## 2018-11-26 MED ORDER — ONDANSETRON HCL 4 MG/2ML IJ SOLN
INTRAMUSCULAR | Status: AC
  Filled 2018-11-26: qty 2

## 2018-11-26 MED ORDER — FENTANYL CITRATE (PF) 100 MCG/2ML IJ SOLN: 25.0000 ug | INTRAMUSCULAR | Status: DC | PRN

## 2018-11-26 MED ORDER — ONDANSETRON 4 MG PO TBDP
4.0000 mg | ORAL_TABLET | Freq: Once | ORAL | Status: AC
  Administered 2018-11-26: 4 mg via ORAL

## 2018-11-26 MED ORDER — IOHEXOL 180 MG/ML  SOLN
INTRAMUSCULAR | Status: DC | PRN
  Administered 2018-11-26: 16:00:00 20 mL

## 2018-11-26 MED ORDER — LIDOCAINE HCL (CARDIAC) PF 100 MG/5ML IV SOSY
PREFILLED_SYRINGE | INTRAVENOUS | Status: DC | PRN
  Administered 2018-11-26: 60 mg via INTRAVENOUS

## 2018-11-26 MED ORDER — MIDAZOLAM HCL 2 MG/2ML IJ SOLN
INTRAMUSCULAR | Status: DC | PRN
  Administered 2018-11-26: 2 mg via INTRAVENOUS

## 2018-11-26 MED ORDER — ONDANSETRON HCL 4 MG PO TABS
ORAL_TABLET | ORAL | Status: AC
  Filled 2018-11-26: qty 1

## 2018-11-26 MED ORDER — ACETAMINOPHEN 325 MG PO TABS: 325.0000 mg | ORAL_TABLET | ORAL | Status: DC | PRN

## 2018-11-26 MED ORDER — KETOROLAC TROMETHAMINE 30 MG/ML IJ SOLN
INTRAMUSCULAR | Status: DC | PRN
  Administered 2018-11-26: 30 mg via INTRAVENOUS

## 2018-11-26 MED ORDER — DEXAMETHASONE SODIUM PHOSPHATE 10 MG/ML IJ SOLN
INTRAMUSCULAR | Status: DC | PRN
  Administered 2018-11-26: 10 mg via INTRAVENOUS

## 2018-11-26 MED ORDER — EPHEDRINE SULFATE 50 MG/ML IJ SOLN
INTRAMUSCULAR | Status: AC
  Filled 2018-11-26: qty 1

## 2018-11-26 SURGICAL SUPPLY — 31 items
BAG DRAIN CYSTO-URO LG1000N (MISCELLANEOUS) ×3 IMPLANT
BASKET LASER NITINOL 1.9FR (BASKET) ×2 IMPLANT
BRUSH SCRUB EZ 1% IODOPHOR (MISCELLANEOUS) ×3 IMPLANT
CATH URETL 5X70 OPEN END (CATHETERS) ×3 IMPLANT
CNTNR SPEC 2.5X3XGRAD LEK (MISCELLANEOUS)
CONT SPEC 4OZ STER OR WHT (MISCELLANEOUS)
CONTAINER SPEC 2.5X3XGRAD LEK (MISCELLANEOUS) IMPLANT
FIBER LASER FLEXIVA 365 (UROLOGICAL SUPPLIES) IMPLANT
FIBER LASER TRACTIP 200 (UROLOGICAL SUPPLIES) ×2 IMPLANT
GLOVE BIO SURGEON STRL SZ7 (GLOVE) ×6 IMPLANT
GLOVE BIO SURGEON STRL SZ7.5 (GLOVE) ×3 IMPLANT
GOWN STRL REUS W/ TWL LRG LVL3 (GOWN DISPOSABLE) ×1 IMPLANT
GOWN STRL REUS W/ TWL LRG LVL4 (GOWN DISPOSABLE) ×1 IMPLANT
GOWN STRL REUS W/ TWL XL LVL3 (GOWN DISPOSABLE) ×1 IMPLANT
GOWN STRL REUS W/TWL LRG LVL3 (GOWN DISPOSABLE) ×2
GOWN STRL REUS W/TWL LRG LVL4 (GOWN DISPOSABLE) ×2
GOWN STRL REUS W/TWL XL LVL3 (GOWN DISPOSABLE) ×2
GUIDEWIRE STR ZIPWIRE 035X150 (MISCELLANEOUS) ×3 IMPLANT
KIT TURNOVER CYSTO (KITS) ×3 IMPLANT
PACK CYSTO AR (MISCELLANEOUS) ×3 IMPLANT
SET CYSTO W/LG BORE CLAMP LF (SET/KITS/TRAYS/PACK) ×3 IMPLANT
SHEATH URETERAL 12FRX35CM (MISCELLANEOUS) ×2 IMPLANT
SOL .9 NS 3000ML IRR  AL (IV SOLUTION) ×4
SOL .9 NS 3000ML IRR UROMATIC (IV SOLUTION) ×1 IMPLANT
SOL PREP PVP 2OZ (MISCELLANEOUS)
SOLUTION PREP PVP 2OZ (MISCELLANEOUS) ×1 IMPLANT
STENT URET 6FRX24 CONTOUR (STENTS) ×3 IMPLANT
STENT URET 6FRX26 CONTOUR (STENTS) ×1 IMPLANT
SURGILUBE 2OZ TUBE FLIPTOP (MISCELLANEOUS) ×3 IMPLANT
SYR 10ML LL (SYRINGE) ×3 IMPLANT
WATER STERILE IRR 1000ML POUR (IV SOLUTION) ×3 IMPLANT

## 2018-11-26 NOTE — Anesthesia Post-op Follow-up Note (Signed)
Anesthesia QCDR form completed.        

## 2018-11-26 NOTE — H&P (Signed)
Date of Initial H&P: 11/26/18  History reviewed, patient examined, no change in status, stable for surgery.

## 2018-11-26 NOTE — Transfer of Care (Signed)
Immediate Anesthesia Transfer of Care Note  Patient: [name]  Procedure(s) Performed: URETEROSCOPY WITH HOLMIUM LASER LITHOTRIPSY (Left Ureter) CYSTOSCOPY WITH STENT PLACEMENT (Left Ureter) CYSTOSCOPY WITH RETROGRADE PYELOGRAM (Left Ureter)  Patient Location: PACU  Anesthesia Type:General  Level of Consciousness: sedated  Airway & Oxygen Therapy: Patient Spontanous Breathing and Patient connected to face mask oxygen  Post-op Assessment: Report given to RN and Post -op Vital signs reviewed and stable  Post vital signs: Reviewed and stable  Last Vitals:  Vitals Value Taken Time  BP 106/69 11/26/18 1616  Temp 36.2 C 11/26/18 1616  Pulse 89 11/26/18 1616  Resp 18 11/26/18 1617  SpO2 98 % 11/26/18 1616  Vitals shown include unvalidated device data.  Last Pain:  Vitals:   11/26/18 1616  TempSrc:   PainSc: 0-No pain         Complications: No apparent anesthesia complications
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
## 2018-11-26 NOTE — Discharge Instructions (Addendum)
Ureteral Stent Implantation, Care After °This sheet gives you information about how to care for yourself after your procedure. Your health care provider may also give you more specific instructions. If you have problems or questions, contact your health care provider. °What can I expect after the procedure? °After the procedure, it is common to have: °· Nausea. °· Mild pain when you urinate. You may feel this pain in your lower back or lower abdomen. The pain should stop within a few minutes after you urinate. This may last for up to 1 week. °· A small amount of blood in your urine for several days. °Follow these instructions at home: °Medicines °· Take over-the-counter and prescription medicines only as told by your health care provider. °· If you were prescribed an antibiotic medicine, take it as told by your health care provider. Do not stop taking the antibiotic even if you start to feel better. °· Do not drive for 24 hours if you were given a sedative during your procedure. °· Ask your health care provider if the medicine prescribed to you requires you to avoid driving or using heavy machinery. °Activity °· Rest as told by your health care provider. °· Avoid sitting for a long time without moving. Get up to take short walks every 1-2 hours. This is important to improve blood flow and breathing. Ask for help if you feel weak or unsteady. °· Return to your normal activities as told by your health care provider. Ask your health care provider what activities are safe for you. °General instructions ° °· Watch for any blood in your urine. Call your health care provider if the amount of blood in your urine increases. °· If you have a catheter: °? Follow instructions from your health care provider about taking care of your catheter and collection bag. °? Do not take baths, swim, or use a hot tub until your health care provider approves. Ask your health care provider if you may take showers. You may only be allowed to  take sponge baths. °· Drink enough fluid to keep your urine pale yellow. °· Do not use any products that contain nicotine or tobacco, such as cigarettes, e-cigarettes, and chewing tobacco. These can delay healing after surgery. If you need help quitting, ask your health care provider. °· Keep all follow-up visits as told by your health care provider. This is important. °Contact a health care provider if: °· You have pain that gets worse or does not get better with medicine, especially pain when you urinate. °· You have difficulty urinating. °· You feel nauseous or you vomit repeatedly during a period of more than 2 days after the procedure. °Get help right away if: °· Your urine is dark red or has blood clots in it. °· You are leaking urine (have incontinence). °· The end of the stent comes out of your urethra. °· You cannot urinate. °· You have sudden, sharp, or severe pain in your abdomen or lower back. °· You have a fever. °· You have swelling or pain in your legs. °· You have difficulty breathing. °Summary °· After the procedure, it is common to have mild pain when you urinate that goes away within a few minutes after you urinate. This may last for up to 1 week. °· Watch for any blood in your urine. Call your health care provider if the amount of blood in your urine increases. °· Take over-the-counter and prescription medicines only as told by your health care provider. °· Drink   enough fluid to keep your urine pale yellow. This information is not intended to replace advice given to you by your health care provider. Make sure you discuss any questions you have with your health care provider. Document Released: 09/09/2012 Document Revised: 10/14/2017 Document Reviewed: 10/15/2017 Elsevier Patient Education  2020 [place]. Kidney Stones  Kidney stones (urolithiasis) are rock-like masses that form inside of the kidneys. Kidneys are organs that make pee (urine). A kidney stone can cause very bad pain and  can block the flow of pee. The stone usually leaves your body (passes) through your pee. You may need to have a doctor take out the stone. Follow these instructions at home: Eating and drinking  Drink enough fluid to keep your pee clear or pale yellow. This will help you pass the stone.  If told by your doctor, change the foods you eat (your diet). This may include: ? Limiting how much salt (sodium) you eat. ? Eating more fruits and vegetables. ? Limiting how much meat, poultry, fish, and eggs you eat.  Follow instructions from your doctor about eating or drinking restrictions. General instructions  Collect pee samples as told by your doctor. You may need to collect a pee sample: ? 24 hours after a stone comes out. ? 8-12 weeks after a stone comes out, and every 6-12 months after that.  Strain your pee every time you pee (urinate), for as long as told. Use the strainer that your doctor recommends.  Do not throw out the stone. Keep it so that it can be tested by your doctor.  Take over-the-counter and prescription medicines only as told by your doctor.  Keep all follow-up visits as told by your doctor. This is important. You may need follow-up tests. Preventing kidney stones To prevent another kidney stone:  Drink enough fluid to keep your pee clear or pale yellow. This is the best way to prevent kidney stones.  Eat healthy foods.  Avoid certain foods as told by your doctor. You may be told to eat less protein.  Stay at a healthy weight. Contact a doctor if:  You have pain that gets worse or does not get better with medicine. Get help right away if:  You have a fever or chills.  You get very bad pain.  You get new pain in your belly (abdomen).  You pass out (faint).  You cannot pee. This information is not intended to replace advice given to you by your health care provider. Make sure you discuss any questions you have with your health care provider. Document  Released: 06/26/2007 Document Revised: 08/19/2017 Document Reviewed: 09/26/2015 Elsevier Patient Education  2020 [place]   1) The drugs that you were given will stay in your system until tomorrow so for the next 24 hours you should not:  A) Drive an automobile B) Make any legal decisions C) Drink any alcoholic beverage   2) You may resume regular meals tomorrow.  Today it is better to start with liquids and gradually work up to solid foods.  You may eat anything you prefer, but it is better to start with liquids, then soup and crackers, and gradually work up to solid foods.   3) Please notify your doctor immediately if you have any unusual bleeding, trouble breathing, redness and pain at the surgery site, drainage, fever, or pain not relieved by medication.    4) Additional Instructions:        Please  contact your physician with any problems or Same Day Surgery at [phone], Monday through Friday 6 am to 4 pm, or [place] [facility] number at [phone].

## 2018-11-26 NOTE — Op Note (Signed)
Preoperative diagnosis: Left ureterolithiasis  Postoperative diagnosis: Same  Procedure: 1.  Left ureteroscopic ureterolithotomy with holmium laser lithotripsy                      2.  Left double-pigtail ureteral stent placement                      3.  Left retrograde pyelography                      4.  Fluoroscopy  Surgeon: Otelia Limes. Yves Dill MD  Anesthesia: General  Indications:See the history and physical. After informed consent the above procedure(s) were requested     Technique and findings: After adequate general anesthesia been obtained patient was placed into dorsal lithotomy position and the perineum was prepped and draped in the usual fashion.  21 French cystoscope coupled to the camera placed into the bladder.  The bladder was thoroughly inspected.  No bladder tumors were identified.  The ureteral orifices were normally located on the trigone.  At this point a 6 [place] open-ended ureteral catheter was passed into the left ureteral orifice and retrograde pyelography performed.  A radio lucent stone present in the proximal left ureter with proximal hydronephrosis.  A 0.035 Glidewire was then passed through the open-ended ureteral catheter beyond the stone and curled into the renal pelvis.  The cystoscope was removed taking care leave the guidewire in position.  The rigid ureteroscope was then really passed through the ureter could not be passed up to the level of the stone due to angulation.  The semirigid ureteroscope was removed and exchanged for a flexible ureteroscope.  Ureteral access sheath was placed and the small ureteroscope passed through the access sheath up to the level of the renal pelvis.  Stone had migrated into the kidney.  The 200 m holmium laser fiber was then passed through the reader scope and set at power level 0.5 J and frequency of 10.  The stone was then fragmented.  The intrarenal collecting structures were sequentially inspected and no additional stones were  identified.  Ureteroscope was then removed and care leave the guidewire in position.  The access sheath was also removed.  The cystoscope was then backloaded over the guidewire and a 6 x 24 cm pigtail stent with retrieval suture passed over the guidewire and positioned in the ureter under fluoroscopic guidance.  Guidewire was then removed taking care leave the stent in good position.  Bladder was then drained and cystoscope was removed.  10 cc of viscous Xylocaine was instilled within the urethra and the bladder.  A B&O suppository was placed.  Procedure was then terminated and patient transferred to the recovery room in stable condition.

## 2018-11-26 NOTE — Anesthesia Procedure Notes (Signed)
Procedure Name: LMA Insertion Performed by: Jozy Mcphearson L, CRNA Pre-anesthesia Checklist: Patient identified, Patient being monitored, Timeout performed, Emergency Drugs available and Suction available Patient Re-evaluated:Patient Re-evaluated prior to induction Oxygen Delivery Method: Circle system utilized Preoxygenation: Pre-oxygenation with 100% oxygen Induction Type: IV induction Ventilation: Mask ventilation without difficulty LMA: LMA inserted LMA Size: 4.0 Tube type: Oral Number of attempts: 1 Placement Confirmation: positive ETCO2 and breath sounds checked- equal and bilateral Tube secured with: Tape Dental Injury: Teeth and Oropharynx as per pre-operative assessment        

## 2018-11-26 NOTE — Progress Notes (Signed)
Pt in PACU; Heart Rhythm showing irregular on monitor. HR 60s. VSS, no complaints of pain per patient.  Notified Dr. Ola Spurr. MD acknowledged. No intervention at this time. Will continue to assess. Per monitor, irregular rhythm ended.
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
## 2018-11-26 NOTE — Anesthesia Postprocedure Evaluation (Signed)
Anesthesia Post Note  Patient: Carol [name]  Procedure(s) Performed: URETEROSCOPY WITH HOLMIUM LASER LITHOTRIPSY (Left Ureter) CYSTOSCOPY WITH STENT PLACEMENT (Left Ureter) CYSTOSCOPY WITH RETROGRADE PYELOGRAM (Left Ureter)  Patient location during evaluation: PACU Anesthesia Type: General Level of consciousness: awake and alert Pain management: pain level controlled Vital Signs Assessment: post-procedure vital signs reviewed and stable Respiratory status: spontaneous breathing, nonlabored ventilation and respiratory function stable Cardiovascular status: blood pressure returned to baseline and stable Postop Assessment: no apparent nausea or vomiting Anesthetic complications: no     Last Vitals:  Vitals:   11/26/18 1705 11/26/18 1803  BP: 126/82 127/78  Pulse: 78 76  Resp: 18 18  Temp: 36.4 C   SpO2: 100% 100%    Last Pain:  Vitals:   11/26/18 1803  TempSrc:   PainSc: 0-No pain                 Durenda Hurt

## 2018-11-27 ENCOUNTER — Encounter: Payer: Self-pay | Admitting: Urology

## 2018-12-07 ENCOUNTER — Other Ambulatory Visit: Payer: Self-pay

## 2018-12-07 DIAGNOSIS — Z20822 Contact with and (suspected) exposure to covid-19: Secondary | ICD-10-CM

## 2018-12-08 LAB — NOVEL CORONAVIRUS, NAA: SARS-CoV-2, NAA: NOT DETECTED

## 2018-12-08 LAB — INPATIENT

## 2018-12-09 ENCOUNTER — Ambulatory Visit: Payer: Self-pay | Admitting: Urology

## 2018-12-10 ENCOUNTER — Ambulatory Visit: Payer: Self-pay | Admitting: Urology

## 2020-01-27 IMAGING — MG MM DIGITAL SCREENING BILAT W/ TOMO W/ CAD
9 of 13 series · 9 of 29 positions shown · non-contrast
Comparison: Previous exam(s).

CLINICAL DATA: Screening.

EXAM:
2D DIGITAL SCREENING BILATERAL MAMMOGRAM WITH 3D TOMO WITH CAD

[R MLO (1 of 2)]
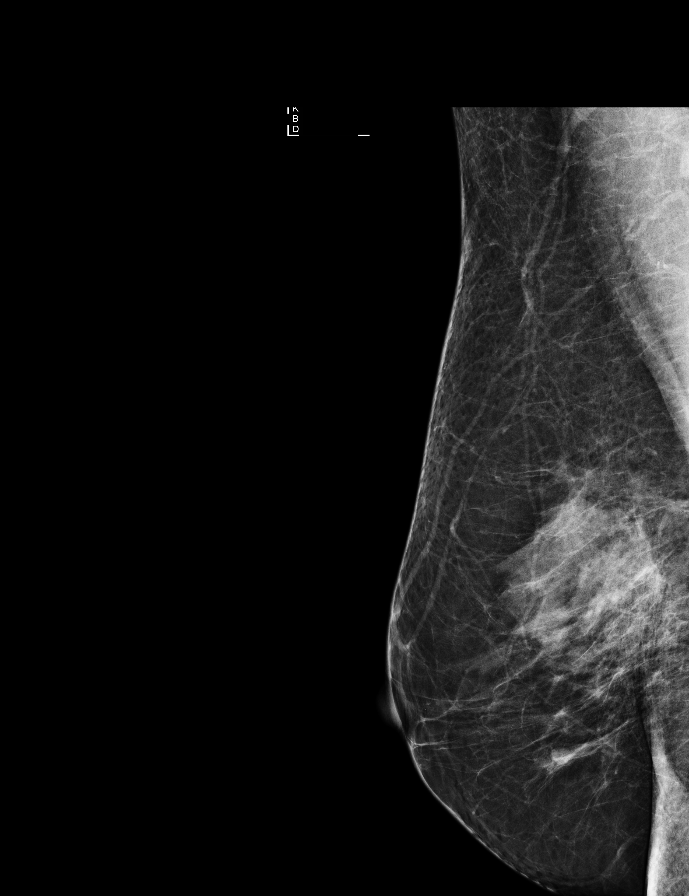

[R CC synth-2D]
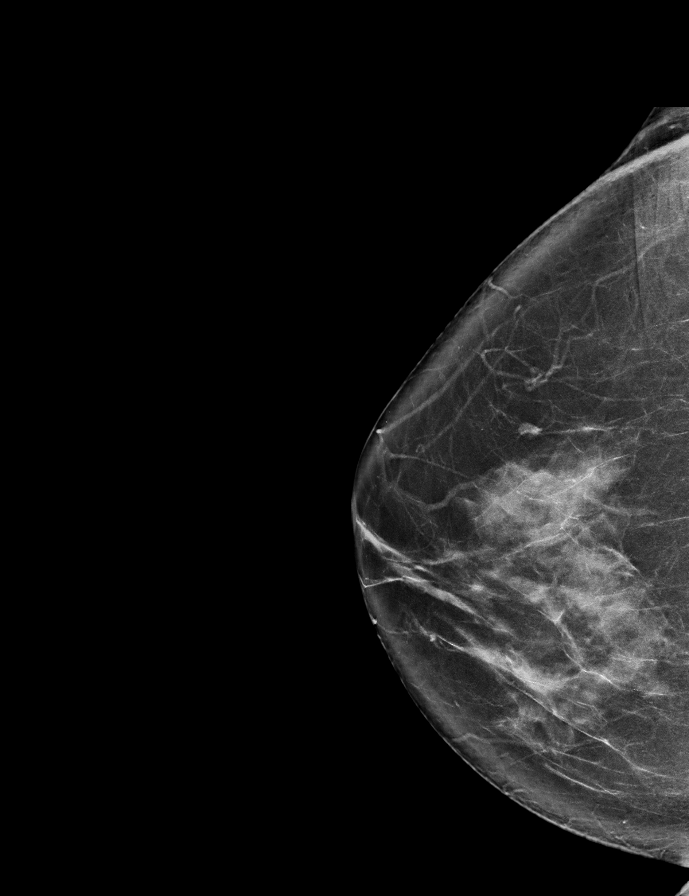

[L CC synth-2D]
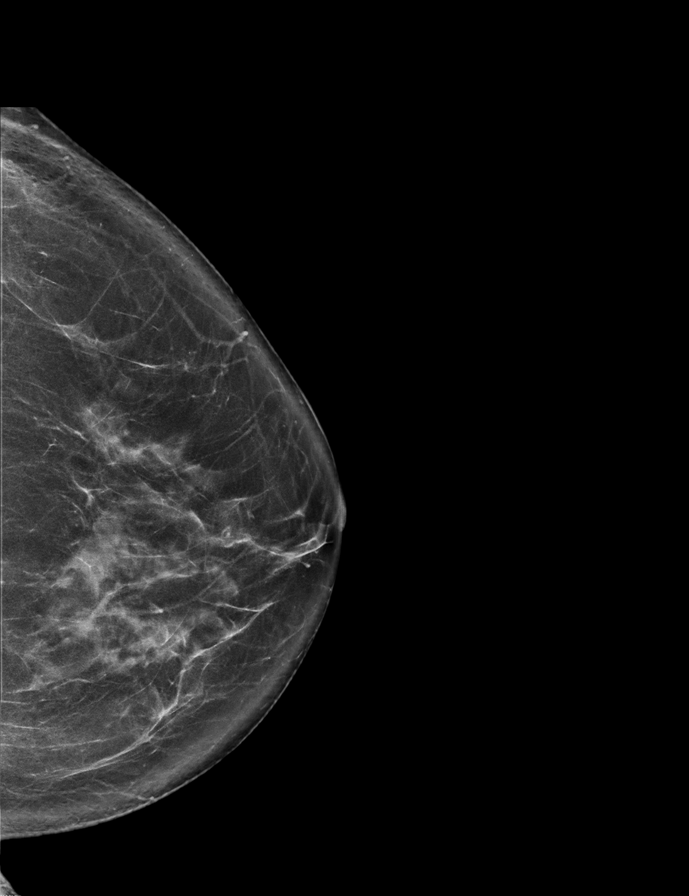

[L MLO]
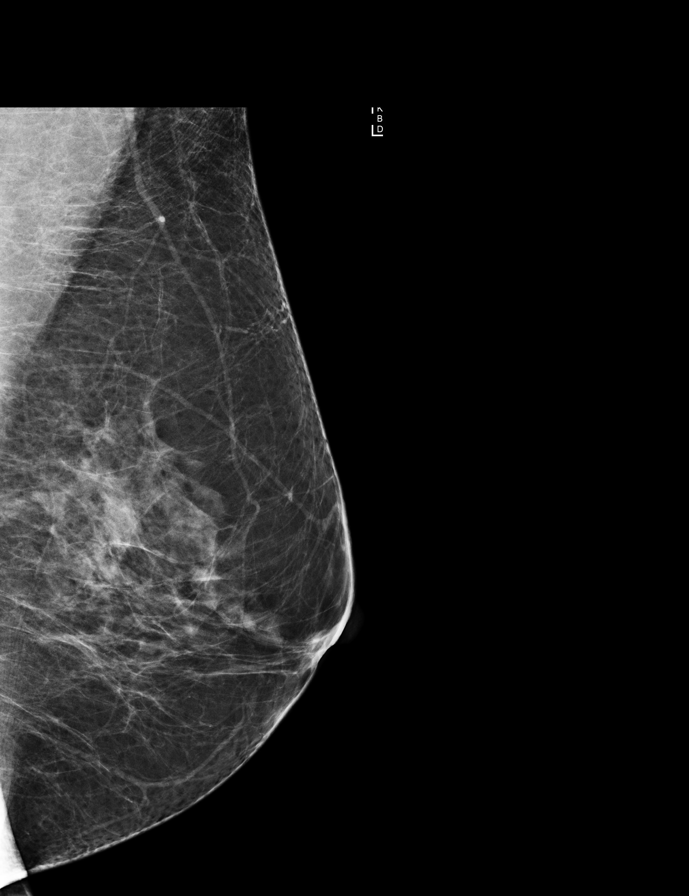

[R CC]
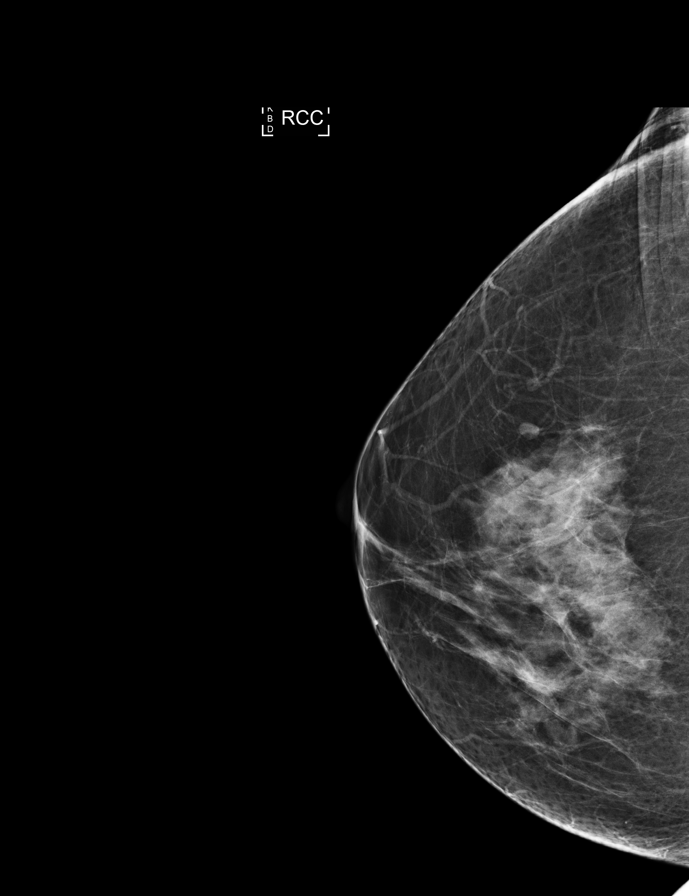

[L CC]
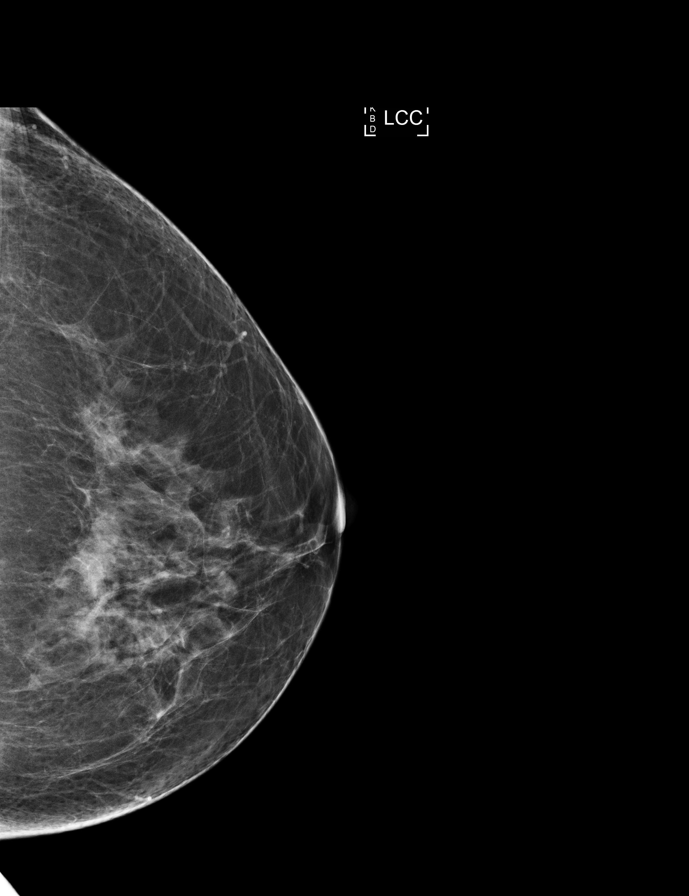

[L MLO synth-2D]
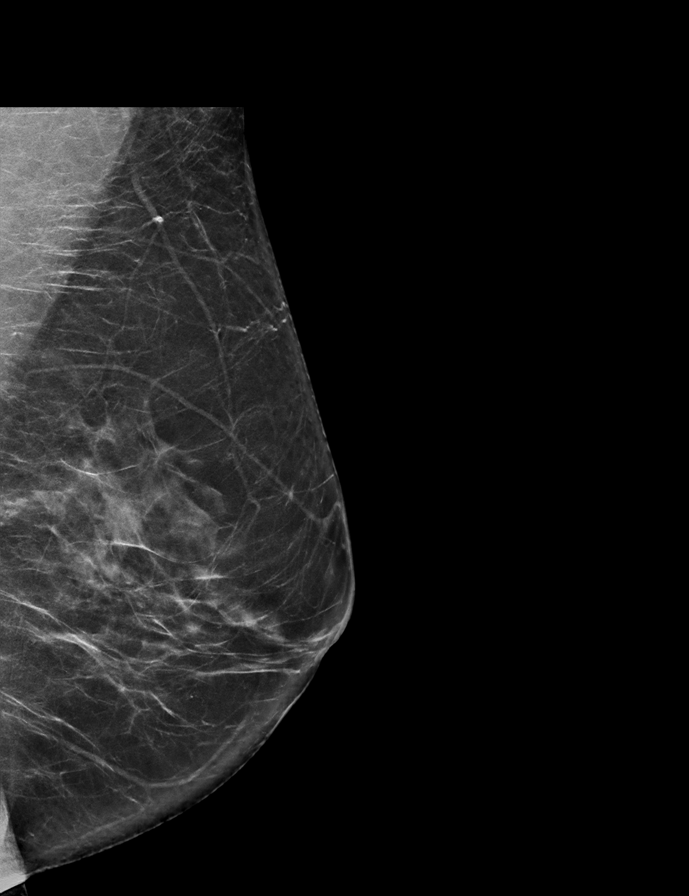

[R MLO (2 of 2)]
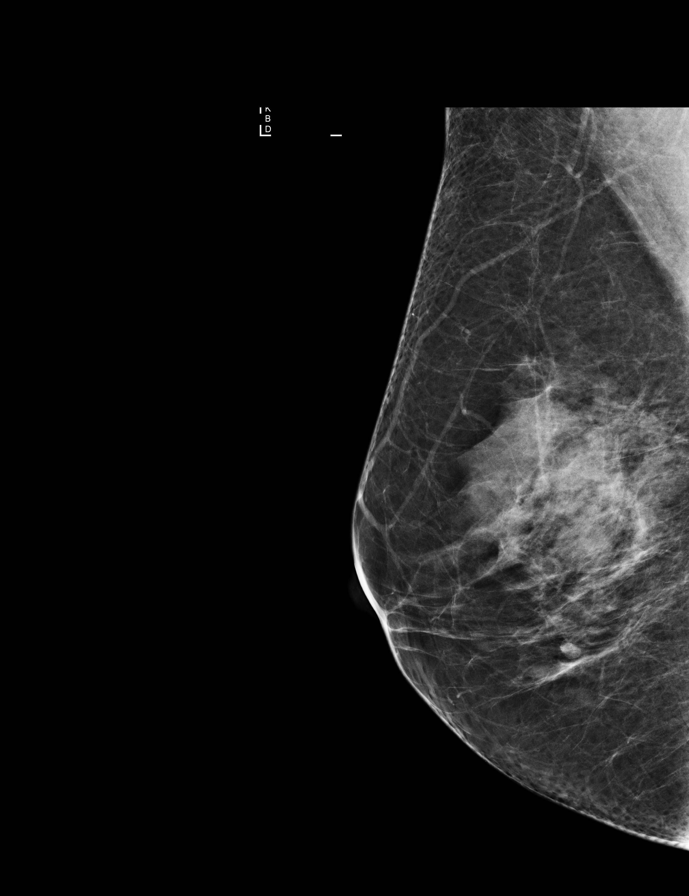

[R MLO synth-2D]
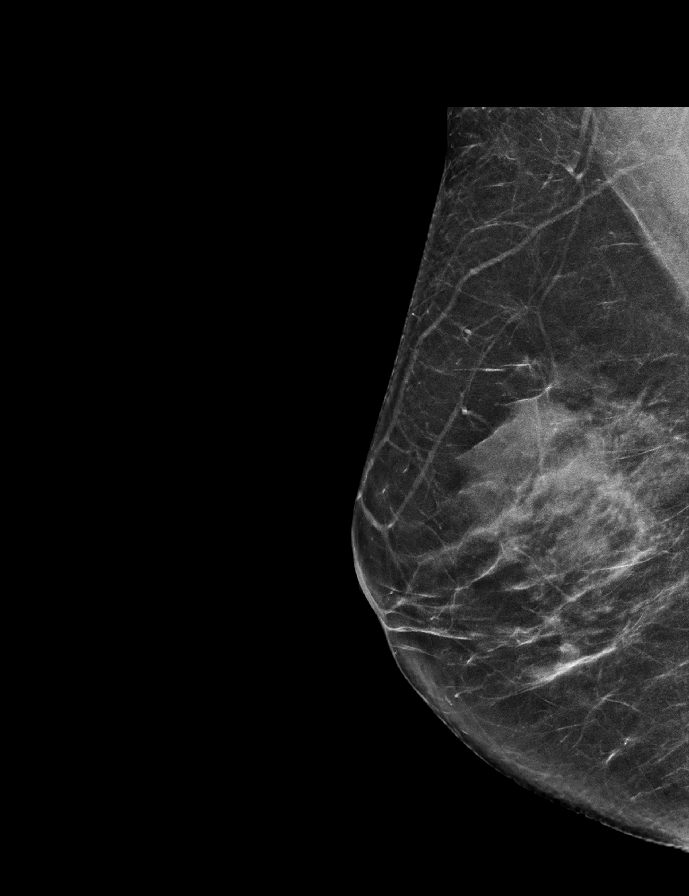

[9 of 29 positions shown; findings below may reference images not displayed]

ACR Breast Density Category c: The breast tissue is heterogeneously
dense, which may obscure small masses.
FINDINGS: There are no findings suspicious for malignancy. Images were
processed with CAD.
IMPRESSION: No mammographic evidence of malignancy. A result letter of this
screening mammogram will be mailed directly to the patient.

RECOMMENDATION:
Screening mammogram in one year. (Code:UA-9-KQN)

BI-RADS CATEGORY  1: Negative.

## 2020-06-06 ENCOUNTER — Other Ambulatory Visit: Payer: Self-pay

## 2020-06-06 ENCOUNTER — Other Ambulatory Visit: Payer: Self-pay | Admitting: Obstetrics and Gynecology

## 2020-06-06 ENCOUNTER — Ambulatory Visit
Admission: RE | Admit: 2020-06-06 | Discharge: 2020-06-06 | Disposition: A | Discharge status: Home / Self Care | Payer: Federal, State, Local not specified - PPO | Source: Ambulatory Visit | Attending: Obstetrics and Gynecology | Admitting: Obstetrics and Gynecology

## 2020-06-06 DIAGNOSIS — Z1231 Encounter for screening mammogram for malignant neoplasm of breast: Secondary | ICD-10-CM | POA: Insufficient documentation

## 2021-08-08 ENCOUNTER — Other Ambulatory Visit: Payer: Self-pay | Admitting: Obstetrics and Gynecology

## 2021-08-08 DIAGNOSIS — Z1231 Encounter for screening mammogram for malignant neoplasm of breast: Secondary | ICD-10-CM

## 2021-09-06 ENCOUNTER — Ambulatory Visit
Admission: RE | Admit: 2021-09-06 | Discharge: 2021-09-06 | Disposition: A | Discharge status: Home / Self Care | Payer: Federal, State, Local not specified - PPO | Source: Ambulatory Visit | Attending: Obstetrics and Gynecology | Admitting: Obstetrics and Gynecology

## 2021-09-06 DIAGNOSIS — Z1231 Encounter for screening mammogram for malignant neoplasm of breast: Secondary | ICD-10-CM | POA: Diagnosis present

## 2021-09-14 ENCOUNTER — Emergency Department (HOSPITAL_COMMUNITY): Payer: Federal, State, Local not specified - PPO

## 2021-09-14 ENCOUNTER — Encounter (HOSPITAL_COMMUNITY): Payer: Self-pay

## 2021-09-14 ENCOUNTER — Inpatient Hospital Stay (HOSPITAL_COMMUNITY)
Admission: EM | Admit: 2021-09-14 | Discharge: 2021-09-16 | DRG: 481 | Disposition: A | Discharge status: Home Health | Payer: Federal, State, Local not specified - PPO | Attending: Orthopedic Surgery | Admitting: Orthopedic Surgery

## 2021-09-14 ENCOUNTER — Other Ambulatory Visit: Payer: Self-pay

## 2021-09-14 DIAGNOSIS — D649 Anemia, unspecified: Secondary | ICD-10-CM | POA: Diagnosis not present

## 2021-09-14 DIAGNOSIS — S72301A Unspecified fracture of shaft of right femur, initial encounter for closed fracture: Principal | ICD-10-CM | POA: Diagnosis present

## 2021-09-14 DIAGNOSIS — W010XXA Fall on same level from slipping, tripping and stumbling without subsequent striking against object, initial encounter: Secondary | ICD-10-CM | POA: Diagnosis present

## 2021-09-14 DIAGNOSIS — D62 Acute posthemorrhagic anemia: Secondary | ICD-10-CM | POA: Diagnosis present

## 2021-09-14 DIAGNOSIS — Q431 Hirschsprung's disease: Secondary | ICD-10-CM | POA: Diagnosis not present

## 2021-09-14 DIAGNOSIS — Z87442 Personal history of urinary calculi: Secondary | ICD-10-CM | POA: Diagnosis not present

## 2021-09-14 DIAGNOSIS — Z79899 Other long term (current) drug therapy: Secondary | ICD-10-CM | POA: Diagnosis not present

## 2021-09-14 DIAGNOSIS — Z885 Allergy status to narcotic agent status: Secondary | ICD-10-CM | POA: Diagnosis not present

## 2021-09-14 DIAGNOSIS — M810 Age-related osteoporosis without current pathological fracture: Secondary | ICD-10-CM | POA: Diagnosis present

## 2021-09-14 DIAGNOSIS — S7290XA Unspecified fracture of unspecified femur, initial encounter for closed fracture: Secondary | ICD-10-CM | POA: Diagnosis present

## 2021-09-14 DIAGNOSIS — Z803 Family history of malignant neoplasm of breast: Secondary | ICD-10-CM

## 2021-09-14 DIAGNOSIS — S72001A Fracture of unspecified part of neck of right femur, initial encounter for closed fracture: Secondary | ICD-10-CM | POA: Diagnosis not present

## 2021-09-14 LAB — COMPREHENSIVE METABOLIC PANEL
ALT: 15 U/L (ref 0–44)
AST: 21 U/L (ref 15–41)
Albumin: 3.8 g/dL (ref 3.5–5.0)
Alkaline Phosphatase: 37 U/L — ABNORMAL LOW (ref 38–126)
Anion gap: 9 (ref 5–15)
BUN: 16 mg/dL (ref 6–20)
CO2: 26 mmol/L (ref 22–32)
Calcium: 9 mg/dL (ref 8.9–10.3)
Chloride: 105 mmol/L (ref 98–111)
Creatinine, Ser: 1.02 mg/dL — ABNORMAL HIGH (ref 0.44–1.00)
GFR, Estimated: >60 mL/min (ref >60)
Glucose, Bld: 106 mg/dL — ABNORMAL HIGH (ref 70–99)
Potassium: 4.6 mmol/L (ref 3.5–5.1)
Sodium: 140 mmol/L (ref 135–145)
Total Bilirubin: 0.6 mg/dL (ref 0.3–1.2)
Total Protein: 6.3 g/dL — ABNORMAL LOW (ref 6.5–8.1)

## 2021-09-14 LAB — CBC WITH DIFFERENTIAL/PLATELET
Abs Immature Granulocytes: 0.02 10*3/uL (ref 0.00–0.07)
Basophils Absolute: 0 10*3/uL (ref 0.0–0.1)
Basophils Relative: 1 %
Eosinophils Absolute: 0.1 10*3/uL (ref 0.0–0.5)
Eosinophils Relative: 2 %
HCT: 24.5 % — ABNORMAL LOW (ref 36.0–46.0)
Hemoglobin: 8 g/dL — ABNORMAL LOW (ref 12.0–15.0)
Immature Granulocytes: 0 %
Lymphocytes Relative: 18 %
Lymphs Abs: 1 10*3/uL (ref 0.7–4.0)
MCH: 31.1 pg (ref 26.0–34.0)
MCHC: 32.7 g/dL (ref 30.0–36.0)
MCV: 95.3 fL (ref 80.0–100.0)
Monocytes Absolute: 0.4 10*3/uL (ref 0.1–1.0)
Monocytes Relative: 7 %
Neutro Abs: 3.9 10*3/uL (ref 1.7–7.7)
Neutrophils Relative %: 72 %
Platelets: 114 10*3/uL — ABNORMAL LOW (ref 150–400)
RBC: 2.57 MIL/uL — ABNORMAL LOW (ref 3.87–5.11)
RDW: 14 % (ref 11.5–15.5)
WBC: 5.4 10*3/uL (ref 4.0–10.5)
nRBC: 0 % (ref 0.0–0.2)

## 2021-09-14 LAB — PREPARE RBC (CROSSMATCH)

## 2021-09-14 MED ORDER — ACETAMINOPHEN 500 MG PO TABS
1000.0000 mg | ORAL_TABLET | Freq: Four times a day (QID) | ORAL | Status: DC
  Administered 2021-09-15 (×2): 1000 mg via ORAL
  Filled 2021-09-14 (×2): qty 2

## 2021-09-14 MED ORDER — HYDROMORPHONE HCL 1 MG/ML IJ SOLN
1.0000 mg | INTRAMUSCULAR | Status: DC | PRN
  Administered 2021-09-14 – 2021-09-15 (×2): 1 mg via INTRAVENOUS
  Filled 2021-09-14 (×4): qty 1

## 2021-09-14 MED ORDER — OXYCODONE HCL 5 MG PO TABS: 5.0000 mg | ORAL_TABLET | ORAL | Status: DC | PRN

## 2021-09-14 MED ORDER — SODIUM CHLORIDE 0.9 % IV SOLN
10.0000 mL/h | Freq: Once | INTRAVENOUS | Status: AC
  Administered 2021-09-14: 10 mL/h via INTRAVENOUS

## 2021-09-14 MED ORDER — ONDANSETRON HCL 4 MG/2ML IJ SOLN
4.0000 mg | Freq: Three times a day (TID) | INTRAMUSCULAR | Status: DC | PRN
Start: 2021-09-14 — End: 2021-09-15
  Filled 2021-09-14: qty 2

## 2021-09-14 MED ORDER — METHOCARBAMOL 750 MG PO TABS: 750.0000 mg | ORAL_TABLET | Freq: Three times a day (TID) | ORAL | Status: DC | PRN

## 2021-09-14 MED ORDER — METHOCARBAMOL 1000 MG/10ML IJ SOLN: 500.0000 mg | Freq: Three times a day (TID) | INTRAVENOUS | Status: DC | PRN

## 2021-09-14 MED ORDER — HYDROMORPHONE HCL 1 MG/ML IJ SOLN
1.0000 mg | Freq: Once | INTRAMUSCULAR | Status: AC
  Administered 2021-09-14: 1 mg via INTRAVENOUS
  Filled 2021-09-14: qty 1

## 2021-09-14 NOTE — ED Provider Notes (Signed)
[facility] EMERGENCY DEPARTMENT Provider Note   CSN: [id number] Arrival date & time: 09/14/21  1936     History  Chief Complaint  Patient presents with   Lytle Michaels    Carol [name] is a 56 y.o. female.  HPI 56 year old female presents after a fall.  She was at a football game and tripped on uneven pavement and fell.  Only injury is to her right proximal thigh.  She denies hitting her head, losing consciousness.  Obvious femur fracture with EMS.  Initial blood pressure was 90 systolic and she was has been given a total of around 1200 cc IV fluid and her blood pressure is normal.  She has no sensory changes in the foot.  Pain is severe and she is been given a total of 150 mcg of fentanyl.  She has history of osteoporosis and is on calcium and vitamin D.  Home Medications Prior to Admission medications   Medication Sig Start Date End Date Taking? Authorizing Provider  calcium carbonate (OS-CAL) 600 MG TABS tablet Take 1,200 mg by mouth daily.    Yes [provider]  Cholecalciferol 1000 units tablet Take 1,000 Units by mouth daily.   Yes [provider]      Allergies    Morphine and related    Review of Systems   Review of Systems  Musculoskeletal:  Positive for arthralgias.  Neurological:  Negative for weakness, numbness and headaches.    Physical Exam Updated Vital Signs BP (!) 109/53   Pulse 70   Temp 98.6 F (37 C) (Oral)   Resp 16   Ht '5\' 5"'$  (1.651 m)   Wt 63.1 kg   SpO2 96%   BMI 23.15 kg/m  Physical Exam Vitals and nursing note reviewed.  Constitutional:      Appearance: She is well-developed.  HENT:     Head: Normocephalic and atraumatic.  Cardiovascular:     Rate and Rhythm: Normal rate and regular rhythm.     Pulses:          Dorsalis pedis pulses are 2+ on the right side.  Pulmonary:     Effort: Pulmonary effort is normal.     Breath sounds: Normal breath sounds.  Abdominal:     General: There is no  distension.     Palpations: Abdomen is soft.     Tenderness: There is no abdominal tenderness.  Musculoskeletal:     Right upper leg: Swelling, deformity and tenderness present.     Right knee: No swelling. No tenderness.     Comments: Normal strength and sensation in the right foot  Skin:    General: Skin is warm and dry.  Neurological:     Mental Status: She is alert.     ED Results / Procedures / Treatments   Labs (all labs ordered are listed, but only abnormal results are displayed) Labs Reviewed  COMPREHENSIVE METABOLIC PANEL - Abnormal; Notable for the following components:      Result Value   Glucose, Bld 106 (*)    Creatinine, Ser 1.02 (*)    Total Protein 6.3 (*)    Alkaline Phosphatase 37 (*)    All other components within normal limits  CBC WITH DIFFERENTIAL/PLATELET - Abnormal; Notable for the following components:   RBC 2.57 (*)    Hemoglobin 8.0 (*)    HCT 24.5 (*)    Platelets 114 (*)    All other components within normal limits  CBC  TYPE AND  SCREEN  PREPARE RBC (CROSSMATCH)    EKG None  Radiology DG Pelvis Portable  Result Date: 09/14/2021 CLINICAL DATA:  Fall down bleachers. EXAM: PORTABLE PELVIS 1-2 VIEWS COMPARISON:  None Available. FINDINGS: There is no evidence of pelvic fracture or diastasis. No pelvic bone lesions are seen. Metallic butterfly is presumably external to the patient. IMPRESSION: Negative. Electronically Signed   By: San Morelle M.D.   On: 09/14/2021 20:08   DG Femur Portable Min 2 Views Right  Result Date: 09/14/2021 CLINICAL DATA:  Fall. EXAM: RIGHT FEMUR PORTABLE 2 VIEW COMPARISON:  None Available. FINDINGS: There is an acute transverse fracture through the mid/proximal femoral diaphysis. There is apex lateral angulation. There is surrounding soft tissue swelling. No dislocation. IMPRESSION: 1. Angulated mid/proximal femoral diaphyseal fracture. Electronically Signed   By: Ronney Asters M.D.   On: 09/14/2021 20:03     Procedures .Critical Care  Performed by: Sherwood Gambler, MD Authorized by: Sherwood Gambler, MD   Critical care provider statement:    Critical care time (minutes):  30   Critical care time was exclusive of:  Separately billable procedures and treating other patients   Critical care was necessary to treat or prevent imminent or life-threatening deterioration of the following conditions:  Trauma and circulatory failure   Critical care was time spent personally by me on the following activities:  Development of treatment plan with patient or surrogate, discussions with consultants, evaluation of patient's response to treatment, examination of patient, ordering and review of laboratory studies, ordering and review of radiographic studies, ordering and performing treatments and interventions, pulse oximetry, re-evaluation of patient's condition and review of old charts     Medications Ordered in ED Medications  HYDROmorphone (DILAUDID) injection 1 mg (1 mg Intravenous Given 09/14/21 2243)  ondansetron (ZOFRAN) injection 4 mg (has no administration in time range)  HYDROmorphone (DILAUDID) injection 1 mg (1 mg Intravenous Given 09/14/21 1956)  0.9 %  sodium chloride infusion (10 mL/hr Intravenous New Bag/Given 09/14/21 2254)    ED Course/ Medical Decision Making/ A&P                           Medical Decision Making Amount and/or Complexity of Data Reviewed Labs: ordered. Radiology: ordered.  Risk Prescription drug management. Decision regarding hospitalization.   She is neurovascular intact.  Was given IV narcotics for pain.  She has a morphine allergy but tolerated Dilaudid fine.  She has significant swelling to her femur though on repeat exams this is not enlarging.  Likely has a hematoma associated with the fracture.  I personally viewed/interpreted the x-ray images.  Discussed with Dr. Percell Miller who will admit and take to the OR tomorrow.  He asked for me to put in temporary admit  orders.  She is found to have a hemoglobin of 8 which is lower than her baseline of around 12-13.  Discussed again with Dr. Percell Miller, recommends a unit of blood but it does not seem like she is actively bleeding and her bleeding seems to have stopped.  He asked for a medicine consultation and I discussed with Dr. Velia Meyer, who will consult.  Otherwise, patient will be admitted and n.p.o. after midnight.  Buck's traction applied per Dr. Debroah Loop recommendation.        Final Clinical Impression(s) / ED Diagnoses Final diagnoses:  Closed fracture of shaft of right femur, unspecified fracture morphology, initial encounter ([place])  Acute blood loss anemia    Rx / DC  Orders ED Discharge Orders     None         Sherwood Gambler, MD 09/14/21 2321
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
## 2021-09-14 NOTE — ED Triage Notes (Signed)
BIB [facility] s/p mechanical fall while at son's football game. Slipped on the bleachers after flip flop got caught. Obvious R femur deformity with external rotation. A/O x 4 on arrival. No head trauma, no thinners, no LOC. Full sensation. 150 fent PTA.
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
## 2021-09-14 NOTE — Progress Notes (Signed)
Orthopedic Tech Progress Note Patient Details:  Carol [name] 07-09-1965 [id number]  Musculoskeletal Traction Type of Traction: Bucks Skin Traction Traction Location: rue Traction Weight: 10 lbs   Post Interventions Patient Tolerated: Well  Edwina Barth 09/14/2021, 11:36 PM
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
## 2021-09-14 NOTE — Consult Note (Signed)
Hospitalist Consult Note    PLEASE NOTE THAT DRAGON DICTATION SOFTWARE WAS USED IN THE CONSTRUCTION OF THIS NOTE.   Carol [name] YHC:[id number] DOB: 1965/08/02 DOA: 09/14/2021  PCP: Kirk Ruths, MD *** Patient coming from: home ***  I have personally briefly reviewed patient's old medical records in [place]  ***: ***  HPI: Carol [name] is a 56 y.o. female with medical history significant for *** who is admitted to [facility] on 09/14/2021 with *** after presenting from home*** to [facility] ED complaining of ***.    ***    ***SOB: Denies any associated orthopnea, PND, or new onset peripheral edema. No recent chest pain, diaphoresis, palpitations, N/V, pre-syncope, or syncope. Not associated with any recent cough, wheezing, hemoptysis, new lower extremity erythema, or calf tenderness. Denies any recent trauma, travel, surgical procedures, or periods of prolonged diminished ambulatory status. No recent melena or hematochezia.   Denies any associated subjective fever, chills, rigors, or generalized myalgias. No recent headache, neck stiffness, rhinitis, rhinorrhea, sore throat, abdominal pain, diarrhea, or rash. No known recent COVID-19 exposures. Denies dysuria, gross hematuria, or change in urinary urgency/frequency.  ***   ***misc/infectious: Denies any subjective fever, chills, rigors, or generalized myalgias. Denies any recent headache, neck stiffness, rhinitis, rhinorrhea, sore throat, sob, wheezing, cough, nausea, vomiting, abdominal pain, diarrhea, or rash. No recent traveling or known COVID-19 exposures. Denies dysuria, gross hematuria, or change in urinary urgency/frequency.  Denies any recent chest pain, diaphoresis, or palpitations. ***    ED Course:  Vital signs in the ED were notable for the following: ***  Labs were notable for the following: ***  Imaging and additional notable ED work-up: ***  While in the ED, the following were  administered: ***  Subsequently, the patient was admitted  ***  ***red    Review of Systems: As per HPI otherwise 10 point review of systems negative.   Past Medical History:  Diagnosis Date   Hirschsprung's disease    History of kidney stones    Neuromuscular disorder ([place])    Osteoporosis     Past Surgical History:  Procedure Laterality Date   COLONOSCOPY WITH PROPOFOL N/A 05/26/2015   Procedure: COLONOSCOPY WITH PROPOFOL;  Surgeon: Manya Silvas, MD;  Location: [facility] ENDOSCOPY;  Service: Endoscopy;  Laterality: N/A;   CYSTOSCOPY W/ RETROGRADES Left 11/26/2018   Procedure: CYSTOSCOPY WITH RETROGRADE PYELOGRAM;  Surgeon: Royston Cowper, MD;  Location: [facility] ORS;  Service: Urology;  Laterality: Left;   CYSTOSCOPY WITH STENT PLACEMENT Left 11/26/2018   Procedure: CYSTOSCOPY WITH STENT PLACEMENT;  Surgeon: Royston Cowper, MD;  Location: [facility] ORS;  Service: Urology;  Laterality: Left;   KNEE SURGERY     MYOMECTOMY     SALPINGECTOMY Left    SKIN GRAFT SPLIT THICKNESS LEG / FOOT Bilateral    URETEROSCOPY WITH HOLMIUM LASER LITHOTRIPSY Left 11/26/2018   Procedure: URETEROSCOPY WITH HOLMIUM LASER LITHOTRIPSY;  Surgeon: Royston Cowper, MD;  Location: [facility] ORS;  Service: Urology;  Laterality: Left;    Social History:  reports that she has never smoked. She has never used smokeless tobacco. She reports that she does not drink alcohol and does not use drugs.   Allergies  Allergen Reactions   Morphine And Related Itching    Family History  Problem Relation Age of Onset   Breast cancer Mother 8    Family history reviewed and not pertinent ***   Prior to Admission medications   Medication Sig Start Date  End Date Taking? Authorizing Provider  calcium carbonate (OS-CAL) 600 MG TABS tablet Take 1,200 mg by mouth daily.     [provider]  cephALEXin (KEFLEX) 500 MG capsule Take 500 mg by mouth 2 (two) times daily. 11/20/18   [provider]  Cholecalciferol  1000 units tablet Take 2,000 Units by mouth daily.    [provider]  HYDROcodone-acetaminophen (NORCO/VICODIN) 5-325 MG tablet Take 1 tablet by mouth every 6 (six) hours as needed. 11/20/18   [provider]  naproxen (NAPROSYN) 500 MG tablet Take 500 mg by mouth 2 (two) times daily as needed. 11/20/18   [provider]  tamsulosin (FLOMAX) 0.4 MG CAPS capsule Take 0.4 mg by mouth daily. 11/20/18   [provider]     Objective    Physical Exam: Vitals:   09/14/21 1942 09/14/21 1944 09/14/21 1945 09/14/21 2100  BP:  130/70 128/89 108/71  Pulse:   84 69  Resp:   18 12  Temp:  98.3 F (36.8 C)    TempSrc:  Oral    SpO2:   97% 96%  Weight: 62.6 kg     Height: '5\' 7"'$  (1.702 m)       General: appears to be stated age; alert, oriented Skin: warm, dry, no rash Head:  AT/[place] Mouth:  Oral mucosa membranes appear moist, normal dentition Neck: supple; trachea midline Heart:  RRR; did not appreciate any M/R/G Lungs: CTAB, did not appreciate any wheezes, rales, or rhonchi Abdomen: + BS; soft, ND, NT Vascular: 2+ pedal pulses b/l; 2+ radial pulses b/l Extremities: no peripheral edema, no muscle wasting Neuro: strength and sensation intact in upper and lower extremities b/l ***   *** Neuro: 5/5 strength of the proximal and distal flexors and extensors of the upper and lower extremities bilaterally; sensation intact in upper and lower extremities b/l; cranial nerves II through XII grossly intact; no pronator drift; no evidence suggestive of slurred speech, dysarthria, or facial droop; Normal muscle tone. No tremors.  *** Neuro: In the setting of the patient's current mental status and associated inability to follow instructions, unable to perform full neurologic exam at this time.  As such, assessment of strength, sensation, and cranial nerves is limited at this time. Patient noted to spontaneously move all 4 extremities. No tremors.  ***    Labs on  Admission: I have personally reviewed following labs and imaging studies  CBC: Recent Labs  Lab 09/14/21 2005  WBC 5.4  NEUTROABS 3.9  HGB 8.0*  HCT 24.5*  MCV 95.3  PLT 161*   Basic Metabolic Panel: Recent Labs  Lab 09/14/21 2005  NA 140  K 4.6  CL 105  CO2 26  GLUCOSE 106*  BUN 16  CREATININE 1.02*  CALCIUM 9.0   GFR: Estimated Creatinine Clearance: 59.2 mL/min (A) (by C-G formula based on SCr of 1.02 mg/dL (H)). Liver Function Tests: Recent Labs  Lab 09/14/21 2005  AST 21  ALT 15  ALKPHOS 37*  BILITOT 0.6  PROT 6.3*  ALBUMIN 3.8   No results for input(s): "LIPASE", "AMYLASE" in the last 168 hours. No results for input(s): "AMMONIA" in the last 168 hours. Coagulation Profile: No results for input(s): "INR", "PROTIME" in the last 168 hours. Cardiac Enzymes: No results for input(s): "CKTOTAL", "CKMB", "CKMBINDEX", "TROPONINI" in the last 168 hours. BNP (last 3 results) No results for input(s): "PROBNP" in the last 8760 hours. HbA1C: No results for input(s): "HGBA1C" in the last 72 hours. CBG: No results  for input(s): "GLUCAP" in the last 168 hours. Lipid Profile: No results for input(s): "CHOL", "HDL", "LDLCALC", "TRIG", "CHOLHDL", "LDLDIRECT" in the last 72 hours. Thyroid Function Tests: No results for input(s): "TSH", "T4TOTAL", "FREET4", "T3FREE", "THYROIDAB" in the last 72 hours. Anemia Panel: No results for input(s): "VITAMINB12", "FOLATE", "FERRITIN", "TIBC", "IRON", "RETICCTPCT" in the last 72 hours. Urine analysis: No results found for: "COLORURINE", "APPEARANCEUR", "LABSPEC", "PHURINE", "GLUCOSEU", "HGBUR", "BILIRUBINUR", "KETONESUR", "PROTEINUR", "UROBILINOGEN", "NITRITE", "LEUKOCYTESUR"  Radiological Exams on Admission: DG Pelvis Portable  Result Date: 09/14/2021 CLINICAL DATA:  Fall down bleachers. EXAM: PORTABLE PELVIS 1-2 VIEWS COMPARISON:  None Available. FINDINGS: There is no evidence of pelvic fracture or diastasis. No pelvic bone  lesions are seen. Metallic butterfly is presumably external to the patient. IMPRESSION: Negative. Electronically Signed   By: San Morelle M.D.   On: 09/14/2021 20:08   DG Femur Portable Min 2 Views Right  Result Date: 09/14/2021 CLINICAL DATA:  Fall. EXAM: RIGHT FEMUR PORTABLE 2 VIEW COMPARISON:  None Available. FINDINGS: There is an acute transverse fracture through the mid/proximal femoral diaphysis. There is apex lateral angulation. There is surrounding soft tissue swelling. No dislocation. IMPRESSION: 1. Angulated mid/proximal femoral diaphyseal fracture. Electronically Signed   By: Ronney Asters M.D.   On: 09/14/2021 20:03     EKG: Independently reviewed, with result as described above. ***   Assessment/Plan   Principal Problem:   Closed femur fracture (HCC)   ***       ***                   ***  DVT prophylaxis: SCD's ***  Code Status: Full code*** Family Communication: none*** Disposition Plan: Per Rounding Team Consults called: none***;  Admission status: ***    PLEASE NOTE THAT DRAGON DICTATION SOFTWARE WAS USED IN THE CONSTRUCTION OF THIS NOTE.   [place] DO [facility]  From [place]   09/14/2021, 9:42 PM   ***

## 2021-09-15 ENCOUNTER — Encounter (HOSPITAL_COMMUNITY)
Admission: EM | Disposition: A | Discharge status: Home Health | Payer: Self-pay | Source: Home / Self Care | Attending: Orthopedic Surgery

## 2021-09-15 ENCOUNTER — Inpatient Hospital Stay (HOSPITAL_COMMUNITY): Payer: Federal, State, Local not specified - PPO

## 2021-09-15 ENCOUNTER — Inpatient Hospital Stay (HOSPITAL_COMMUNITY): Payer: Federal, State, Local not specified - PPO | Admitting: Anesthesiology

## 2021-09-15 ENCOUNTER — Encounter (HOSPITAL_COMMUNITY): Payer: Self-pay | Admitting: Orthopedic Surgery

## 2021-09-15 ENCOUNTER — Other Ambulatory Visit: Payer: Self-pay

## 2021-09-15 DIAGNOSIS — D649 Anemia, unspecified: Secondary | ICD-10-CM | POA: Diagnosis not present

## 2021-09-15 DIAGNOSIS — S72001A Fracture of unspecified part of neck of right femur, initial encounter for closed fracture: Secondary | ICD-10-CM | POA: Diagnosis not present

## 2021-09-15 DIAGNOSIS — D62 Acute posthemorrhagic anemia: Secondary | ICD-10-CM | POA: Diagnosis not present

## 2021-09-15 DIAGNOSIS — S72301A Unspecified fracture of shaft of right femur, initial encounter for closed fracture: Secondary | ICD-10-CM | POA: Diagnosis not present

## 2021-09-15 HISTORY — PX: FEMUR IM NAIL: SHX1597

## 2021-09-15 LAB — SURGICAL PCR SCREEN
MRSA, PCR: NEGATIVE
Staphylococcus aureus: POSITIVE — AB

## 2021-09-15 LAB — BPAM RBC
Blood Product Expiration Date: 202309082359
ISSUE DATE / TIME: 202308252324
Unit Type and Rh: 6200

## 2021-09-15 LAB — CBC
HCT: 37.5 % (ref 36.0–46.0)
Hemoglobin: 12.8 g/dL (ref 12.0–15.0)
MCH: 30.5 pg (ref 26.0–34.0)
MCHC: 34.1 g/dL (ref 30.0–36.0)
MCV: 89.3 fL (ref 80.0–100.0)
Platelets: 169 10*3/uL (ref 150–400)
RBC: 4.2 MIL/uL (ref 3.87–5.11)
RDW: 14.1 % (ref 11.5–15.5)
WBC: 7.5 10*3/uL (ref 4.0–10.5)
nRBC: 0 % (ref 0.0–0.2)

## 2021-09-15 LAB — BASIC METABOLIC PANEL
Anion gap: 8 (ref 5–15)
BUN: 14 mg/dL (ref 6–20)
CO2: 24 mmol/L (ref 22–32)
Calcium: 8.6 mg/dL — ABNORMAL LOW (ref 8.9–10.3)
Chloride: 105 mmol/L (ref 98–111)
Creatinine, Ser: 0.93 mg/dL (ref 0.44–1.00)
GFR, Estimated: >60 mL/min (ref >60)
Glucose, Bld: 100 mg/dL — ABNORMAL HIGH (ref 70–99)
Potassium: 3.9 mmol/L (ref 3.5–5.1)
Sodium: 137 mmol/L (ref 135–145)

## 2021-09-15 LAB — RETICULOCYTES
Immature Retic Fract: 4.9 % (ref 2.3–15.9)
RBC.: 4.18 MIL/uL (ref 3.87–5.11)
Retic Count, Absolute: 46.8 10*3/uL (ref 19.0–186.0)
Retic Ct Pct: 1.1 % (ref 0.4–3.1)

## 2021-09-15 LAB — URINALYSIS, ROUTINE W REFLEX MICROSCOPIC
Bacteria, UA: NONE SEEN
Bilirubin Urine: NEGATIVE
Glucose, UA: 150 mg/dL — AB
Hgb urine dipstick: NEGATIVE
Ketones, ur: NEGATIVE mg/dL
Nitrite: NEGATIVE
Protein, ur: NEGATIVE mg/dL
Specific Gravity, Urine: 1.017 (ref 1.005–1.030)
pH: 6 (ref 5.0–8.0)

## 2021-09-15 LAB — PROTIME-INR
INR: 1.1 (ref 0.8–1.2)
Prothrombin Time: 14 seconds (ref 11.4–15.2)

## 2021-09-15 LAB — TYPE AND SCREEN
ABO/RH(D): A POS
Antibody Screen: NEGATIVE
Unit division: 0

## 2021-09-15 LAB — IRON AND TIBC
Iron: 119 ug/dL (ref 28–170)
Saturation Ratios: 36 % — ABNORMAL HIGH (ref 10.4–31.8)
TIBC: 330 ug/dL (ref 250–450)
UIBC: 211 ug/dL

## 2021-09-15 LAB — FOLATE: Folate: 10.6 ng/mL (ref >5.9)

## 2021-09-15 LAB — VITAMIN B12: Vitamin B-12: 302 pg/mL (ref 180–914)

## 2021-09-15 LAB — FERRITIN: Ferritin: 71 ng/mL (ref 11–307)

## 2021-09-15 SURGERY — INSERTION, INTRAMEDULLARY ROD, FEMUR
Anesthesia: General | Site: Hip | Laterality: Right

## 2021-09-15 MED ORDER — VITAMIN D 25 MCG (1000 UNIT) PO TABS
1000.0000 [IU] | ORAL_TABLET | Freq: Every day | ORAL | Status: DC
  Administered 2021-09-15 – 2021-09-16 (×2): 1000 [IU] via ORAL
  Filled 2021-09-15 (×2): qty 1

## 2021-09-15 MED ORDER — ORAL CARE MOUTH RINSE: 15.0000 mL | OROMUCOSAL | Status: DC | PRN

## 2021-09-15 MED ORDER — CHLORHEXIDINE GLUCONATE 0.12 % MT SOLN
OROMUCOSAL | Status: AC
  Administered 2021-09-15: 15 mL via OROMUCOSAL
  Filled 2021-09-15: qty 15

## 2021-09-15 MED ORDER — ORAL CARE MOUTH RINSE
15.0000 mL | Freq: Once | OROMUCOSAL | Status: AC
Start: 2021-09-15 — End: 2021-09-15

## 2021-09-15 MED ORDER — PANTOPRAZOLE SODIUM 40 MG PO TBEC
40.0000 mg | DELAYED_RELEASE_TABLET | Freq: Every day | ORAL | Status: DC
  Administered 2021-09-15 – 2021-09-16 (×2): 40 mg via ORAL
  Filled 2021-09-15 (×2): qty 1

## 2021-09-15 MED ORDER — POLYETHYLENE GLYCOL 3350 17 G PO PACK
17.0000 g | PACK | Freq: Every day | ORAL | Status: DC | PRN
Start: 2021-09-15 — End: 2021-09-16

## 2021-09-15 MED ORDER — CEFAZOLIN SODIUM-DEXTROSE 2-4 GM/100ML-% IV SOLN
2.0000 g | Freq: Four times a day (QID) | INTRAVENOUS | Status: AC
  Administered 2021-09-15 (×2): 2 g via INTRAVENOUS
  Filled 2021-09-15 (×2): qty 100

## 2021-09-15 MED ORDER — ONDANSETRON HCL 4 MG/2ML IJ SOLN
INTRAMUSCULAR | Status: AC
  Filled 2021-09-15: qty 2

## 2021-09-15 MED ORDER — DOCUSATE SODIUM 100 MG PO CAPS
100.0000 mg | ORAL_CAPSULE | Freq: Two times a day (BID) | ORAL | Status: DC
  Administered 2021-09-15 – 2021-09-16 (×3): 100 mg via ORAL
  Filled 2021-09-15 (×3): qty 1

## 2021-09-15 MED ORDER — BISACODYL 10 MG RE SUPP: 10.0000 mg | Freq: Every day | RECTAL | Status: DC | PRN

## 2021-09-15 MED ORDER — CHLORHEXIDINE GLUCONATE 0.12 % MT SOLN
15.0000 mL | Freq: Once | OROMUCOSAL | Status: AC
Start: 2021-09-15 — End: 2021-09-15

## 2021-09-15 MED ORDER — LACTATED RINGERS IV SOLN: INTRAVENOUS | Status: DC

## 2021-09-15 MED ORDER — TRANEXAMIC ACID-NACL 1000-0.7 MG/100ML-% IV SOLN
1000.0000 mg | Freq: Once | INTRAVENOUS | Status: AC
  Administered 2021-09-15: 1000 mg via INTRAVENOUS
  Filled 2021-09-15: qty 100

## 2021-09-15 MED ORDER — PROPOFOL 10 MG/ML IV BOLUS
INTRAVENOUS | Status: AC
  Filled 2021-09-15: qty 20

## 2021-09-15 MED ORDER — ONDANSETRON HCL 4 MG/2ML IJ SOLN
INTRAMUSCULAR | Status: DC | PRN
  Administered 2021-09-15: 4 mg via INTRAVENOUS

## 2021-09-15 MED ORDER — FENTANYL CITRATE (PF) 250 MCG/5ML IJ SOLN
INTRAMUSCULAR | Status: DC | PRN
Start: 2021-09-15 — End: 2021-09-15
  Administered 2021-09-15 (×2): 50 ug via INTRAVENOUS

## 2021-09-15 MED ORDER — ACETAMINOPHEN 325 MG PO TABS: 325.0000 mg | ORAL_TABLET | Freq: Four times a day (QID) | ORAL | Status: DC | PRN

## 2021-09-15 MED ORDER — CELECOXIB 200 MG PO CAPS
200.0000 mg | ORAL_CAPSULE | Freq: Once | ORAL | Status: DC
  Filled 2021-09-15: qty 1

## 2021-09-15 MED ORDER — MUPIROCIN 2 % EX OINT
1.0000 | TOPICAL_OINTMENT | Freq: Two times a day (BID) | CUTANEOUS | Status: DC
  Administered 2021-09-15 – 2021-09-16 (×4): 1 via NASAL
  Filled 2021-09-15 (×3): qty 22

## 2021-09-15 MED ORDER — ONDANSETRON HCL 4 MG PO TABS: 4.0000 mg | ORAL_TABLET | Freq: Four times a day (QID) | ORAL | Status: DC | PRN

## 2021-09-15 MED ORDER — ACETAMINOPHEN 500 MG PO TABS
1000.0000 mg | ORAL_TABLET | Freq: Four times a day (QID) | ORAL | Status: AC
  Administered 2021-09-15 – 2021-09-16 (×4): 1000 mg via ORAL
  Filled 2021-09-15 (×4): qty 2

## 2021-09-15 MED ORDER — PROMETHAZINE HCL 25 MG/ML IJ SOLN: 6.2500 mg | INTRAMUSCULAR | Status: DC | PRN

## 2021-09-15 MED ORDER — SODIUM CHLORIDE 0.9 % IR SOLN
Status: DC | PRN
  Administered 2021-09-15: 1000 mL

## 2021-09-15 MED ORDER — CEFAZOLIN SODIUM-DEXTROSE 2-4 GM/100ML-% IV SOLN
2.0000 g | INTRAVENOUS | Status: AC
  Administered 2021-09-15: 2 g via INTRAVENOUS
  Filled 2021-09-15: qty 100

## 2021-09-15 MED ORDER — LIDOCAINE 2% (20 MG/ML) 5 ML SYRINGE
INTRAMUSCULAR | Status: DC | PRN
  Administered 2021-09-15: 60 mg via INTRAVENOUS

## 2021-09-15 MED ORDER — TRANEXAMIC ACID-NACL 1000-0.7 MG/100ML-% IV SOLN
1000.0000 mg | INTRAVENOUS | Status: AC
  Administered 2021-09-15: 1000 mg via INTRAVENOUS
  Filled 2021-09-15: qty 100

## 2021-09-15 MED ORDER — POVIDONE-IODINE 10 % EX SWAB: 2.0000 | Freq: Once | CUTANEOUS | Status: DC

## 2021-09-15 MED ORDER — MENTHOL 3 MG MT LOZG: 1.0000 | LOZENGE | OROMUCOSAL | Status: DC | PRN

## 2021-09-15 MED ORDER — SUGAMMADEX SODIUM 200 MG/2ML IV SOLN
INTRAVENOUS | Status: DC | PRN
  Administered 2021-09-15: 200 mg via INTRAVENOUS

## 2021-09-15 MED ORDER — MIDAZOLAM HCL 2 MG/2ML IJ SOLN
INTRAMUSCULAR | Status: AC
  Filled 2021-09-15: qty 2

## 2021-09-15 MED ORDER — ACETAMINOPHEN 500 MG PO TABS: 1000.0000 mg | ORAL_TABLET | Freq: Once | ORAL | Status: DC

## 2021-09-15 MED ORDER — OXYCODONE HCL 5 MG PO TABS
10.0000 mg | ORAL_TABLET | ORAL | Status: DC | PRN
  Administered 2021-09-16: 15 mg via ORAL
  Filled 2021-09-15: qty 3

## 2021-09-15 MED ORDER — ENOXAPARIN SODIUM 40 MG/0.4ML IJ SOSY
40.0000 mg | PREFILLED_SYRINGE | Freq: Every day | INTRAMUSCULAR | Status: DC
  Administered 2021-09-16: 40 mg via SUBCUTANEOUS
  Filled 2021-09-15: qty 0.4

## 2021-09-15 MED ORDER — CALCIUM CARBONATE ANTACID 500 MG PO CHEW
2.0000 | CHEWABLE_TABLET | Freq: Every day | ORAL | Status: DC
Start: 2021-09-15 — End: 2021-09-16
  Administered 2021-09-15 – 2021-09-16 (×2): 400 mg via ORAL
  Filled 2021-09-15 (×2): qty 2

## 2021-09-15 MED ORDER — SUCCINYLCHOLINE CHLORIDE 200 MG/10ML IV SOSY
PREFILLED_SYRINGE | INTRAVENOUS | Status: DC | PRN
  Administered 2021-09-15: 60 mg via INTRAVENOUS

## 2021-09-15 MED ORDER — OXYCODONE HCL 5 MG PO TABS: 5.0000 mg | ORAL_TABLET | ORAL | Status: DC | PRN

## 2021-09-15 MED ORDER — HYDROMORPHONE HCL 1 MG/ML IJ SOLN
0.2500 mg | INTRAMUSCULAR | Status: DC | PRN
  Administered 2021-09-15: 0.25 mg via INTRAVENOUS

## 2021-09-15 MED ORDER — METHOCARBAMOL 500 MG PO TABS: 500.0000 mg | ORAL_TABLET | Freq: Four times a day (QID) | ORAL | Status: DC | PRN

## 2021-09-15 MED ORDER — GLYCOPYRROLATE 0.2 MG/ML IJ SOLN
INTRAMUSCULAR | Status: DC | PRN
  Administered 2021-09-15: .1 mg via INTRAVENOUS

## 2021-09-15 MED ORDER — CHLORHEXIDINE GLUCONATE CLOTH 2 % EX PADS
6.0000 | MEDICATED_PAD | Freq: Every day | CUTANEOUS | Status: DC
  Administered 2021-09-15: 6 via TOPICAL

## 2021-09-15 MED ORDER — METOCLOPRAMIDE HCL 5 MG PO TABS: 5.0000 mg | ORAL_TABLET | Freq: Three times a day (TID) | ORAL | Status: DC | PRN

## 2021-09-15 MED ORDER — MIDAZOLAM HCL 2 MG/2ML IJ SOLN
INTRAMUSCULAR | Status: DC | PRN
  Administered 2021-09-15: 2 mg via INTRAVENOUS

## 2021-09-15 MED ORDER — SCOPOLAMINE 1 MG/3DAYS TD PT72
MEDICATED_PATCH | TRANSDERMAL | Status: DC | PRN
  Administered 2021-09-15: 1 via TRANSDERMAL

## 2021-09-15 MED ORDER — METOCLOPRAMIDE HCL 5 MG/ML IJ SOLN: 5.0000 mg | Freq: Three times a day (TID) | INTRAMUSCULAR | Status: DC | PRN

## 2021-09-15 MED ORDER — DEXAMETHASONE SODIUM PHOSPHATE 10 MG/ML IJ SOLN
INTRAMUSCULAR | Status: AC
  Filled 2021-09-15: qty 1

## 2021-09-15 MED ORDER — ONDANSETRON HCL 4 MG/2ML IJ SOLN: 4.0000 mg | Freq: Four times a day (QID) | INTRAMUSCULAR | Status: DC | PRN

## 2021-09-15 MED ORDER — FENTANYL CITRATE (PF) 250 MCG/5ML IJ SOLN
INTRAMUSCULAR | Status: AC
  Filled 2021-09-15: qty 5

## 2021-09-15 MED ORDER — PHENYLEPHRINE HCL-NACL 20-0.9 MG/250ML-% IV SOLN
INTRAVENOUS | Status: DC | PRN
  Administered 2021-09-15: 30 ug/min via INTRAVENOUS

## 2021-09-15 MED ORDER — PROPOFOL 10 MG/ML IV BOLUS
INTRAVENOUS | Status: DC | PRN
  Administered 2021-09-15: 110 mg via INTRAVENOUS

## 2021-09-15 MED ORDER — ROCURONIUM BROMIDE 10 MG/ML (PF) SYRINGE
PREFILLED_SYRINGE | INTRAVENOUS | Status: AC
  Filled 2021-09-15: qty 10

## 2021-09-15 MED ORDER — DIPHENHYDRAMINE HCL 50 MG/ML IJ SOLN
12.5000 mg | Freq: Four times a day (QID) | INTRAMUSCULAR | Status: DC | PRN
  Administered 2021-09-15: 12.5 mg via INTRAVENOUS
  Filled 2021-09-15: qty 1

## 2021-09-15 MED ORDER — METHOCARBAMOL 1000 MG/10ML IJ SOLN: 500.0000 mg | Freq: Four times a day (QID) | INTRAVENOUS | Status: DC | PRN

## 2021-09-15 MED ORDER — ALUM & MAG HYDROXIDE-SIMETH 200-200-20 MG/5ML PO SUSP: 30.0000 mL | ORAL | Status: DC | PRN

## 2021-09-15 MED ORDER — HYDROMORPHONE HCL 1 MG/ML IJ SOLN: 0.5000 mg | INTRAMUSCULAR | Status: DC | PRN

## 2021-09-15 MED ORDER — DEXAMETHASONE SODIUM PHOSPHATE 10 MG/ML IJ SOLN
8.0000 mg | Freq: Once | INTRAMUSCULAR | Status: DC
  Filled 2021-09-15: qty 1

## 2021-09-15 MED ORDER — PHENYLEPHRINE 80 MCG/ML (10ML) SYRINGE FOR IV PUSH (FOR BLOOD PRESSURE SUPPORT)
PREFILLED_SYRINGE | INTRAVENOUS | Status: DC | PRN
  Administered 2021-09-15: 40 ug via INTRAVENOUS
  Administered 2021-09-15: 80 ug via INTRAVENOUS

## 2021-09-15 MED ORDER — ENSURE PRE-SURGERY PO LIQD
296.0000 mL | Freq: Once | ORAL | Status: AC
  Administered 2021-09-15: 296 mL via ORAL
  Filled 2021-09-15: qty 296

## 2021-09-15 MED ORDER — PHENOL 1.4 % MT LIQD: 1.0000 | OROMUCOSAL | Status: DC | PRN

## 2021-09-15 MED ORDER — DEXAMETHASONE SODIUM PHOSPHATE 10 MG/ML IJ SOLN
INTRAMUSCULAR | Status: DC | PRN
  Administered 2021-09-15: 10 mg via INTRAVENOUS

## 2021-09-15 MED ORDER — ROCURONIUM BROMIDE 10 MG/ML (PF) SYRINGE
PREFILLED_SYRINGE | INTRAVENOUS | Status: DC | PRN
  Administered 2021-09-15: 50 mg via INTRAVENOUS

## 2021-09-15 MED ORDER — ZOLPIDEM TARTRATE 5 MG PO TABS: 5.0000 mg | ORAL_TABLET | Freq: Every evening | ORAL | Status: DC | PRN

## 2021-09-15 MED ORDER — PHENYLEPHRINE 80 MCG/ML (10ML) SYRINGE FOR IV PUSH (FOR BLOOD PRESSURE SUPPORT): PREFILLED_SYRINGE | INTRAVENOUS | Status: DC | PRN

## 2021-09-15 MED ORDER — HYDROMORPHONE HCL 1 MG/ML IJ SOLN
INTRAMUSCULAR | Status: AC
  Filled 2021-09-15: qty 1

## 2021-09-15 MED ORDER — LIDOCAINE 2% (20 MG/ML) 5 ML SYRINGE
INTRAMUSCULAR | Status: AC
  Filled 2021-09-15: qty 5

## 2021-09-15 SURGICAL SUPPLY — 49 items
BAG COUNTER SPONGE SURGICOUNT (BAG) ×1 IMPLANT
BIT DRILL AO GAMMA 4.2X180 (BIT) IMPLANT
CLOSURE STERI STRIP 1/2 X4 (GAUZE/BANDAGES/DRESSINGS) IMPLANT
CLSR STERI-STRIP ANTIMIC 1/2X4 (GAUZE/BANDAGES/DRESSINGS) ×1 IMPLANT
COVER MAYO STAND STRL (DRAPES) ×1 IMPLANT
COVER PERINEAL POST (MISCELLANEOUS) ×1 IMPLANT
COVER SURGICAL LIGHT HANDLE (MISCELLANEOUS) ×1 IMPLANT
DRAPE STERI IOBAN 125X83 (DRAPES) ×1 IMPLANT
DRSG MEPILEX BORDER 4X4 (GAUZE/BANDAGES/DRESSINGS) ×3 IMPLANT
DURAPREP 26ML APPLICATOR (WOUND CARE) ×1 IMPLANT
ELECT REM PT RETURN 9FT ADLT (ELECTROSURGICAL) ×1
ELECTRODE REM PT RTRN 9FT ADLT (ELECTROSURGICAL) ×1 IMPLANT
GLOVE BIO SURGEON STRL SZ7.5 (GLOVE) ×1 IMPLANT
GLOVE BIOGEL PI IND STRL 7.5 (GLOVE) ×1 IMPLANT
GLOVE BIOGEL PI IND STRL 8 (GLOVE) ×1 IMPLANT
GLOVE BIOGEL PI INDICATOR 7.5 (GLOVE) ×1
GLOVE BIOGEL PI INDICATOR 8 (GLOVE) ×1
GLOVE SURG SYN 7.5  E (GLOVE) ×1
GLOVE SURG SYN 7.5 E (GLOVE) ×1 IMPLANT
GLOVE SURG SYN 7.5 PF PI (GLOVE) ×1 IMPLANT
GOWN STRL REUS W/ TWL LRG LVL3 (GOWN DISPOSABLE) ×2 IMPLANT
GOWN STRL REUS W/ TWL XL LVL3 (GOWN DISPOSABLE) ×2 IMPLANT
GOWN STRL REUS W/TWL LRG LVL3 (GOWN DISPOSABLE) ×2
GOWN STRL REUS W/TWL XL LVL3 (GOWN DISPOSABLE) ×2
GUIDEROD T2 3X1000 (ROD) IMPLANT
GUIDEWIRE GAMMA (WIRE) IMPLANT
K-WIRE RECON 3.2X400 (WIRE) ×2
KIT BASIN OR (CUSTOM PROCEDURE TRAY) ×1 IMPLANT
KIT TURNOVER KIT B (KITS) ×1 IMPLANT
KWIRE RECON 3.2X400 (WIRE) IMPLANT
MANIFOLD NEPTUNE II (INSTRUMENTS) ×1 IMPLANT
NAIL T2 RECON RT 5 09X380X125 (Miscellaneous) IMPLANT
NAIL T2 RECON RT 5, 09X380X125 (Miscellaneous) ×1 IMPLANT
NS IRRIG 1000ML POUR BTL (IV SOLUTION) ×1 IMPLANT
PACK GENERAL/GYN (CUSTOM PROCEDURE TRAY) ×1 IMPLANT
PAD ARMBOARD 7.5X6 YLW CONV (MISCELLANEOUS) ×2 IMPLANT
REAMER SHAFT BIXCUT (INSTRUMENTS) IMPLANT
SCREW LAG RECON 6.5X85MM (Screw) IMPLANT
SCREW LOCKING T2 F/T  5X42.5MM (Screw) ×1 IMPLANT
SCREW LOCKING T2 F/T 5X42.5MM (Screw) IMPLANT
SCREW LOCKING THREADED 5X47.5 (Screw) IMPLANT
STEPDRILL FOR LAG SCREW RECON (DRILL) IMPLANT
STRIP CLOSURE SKIN 1/2X4 (GAUZE/BANDAGES/DRESSINGS) ×1 IMPLANT
SUT MNCRL AB 4-0 PS2 18 (SUTURE) ×1 IMPLANT
SUT VIC AB 2-0 CT1 27 (SUTURE) ×1
SUT VIC AB 2-0 CT1 TAPERPNT 27 (SUTURE) ×1 IMPLANT
TOWEL GREEN STERILE (TOWEL DISPOSABLE) ×1 IMPLANT
TOWEL OR NON WOVEN STRL DISP B (DISPOSABLE) ×1 IMPLANT
WATER STERILE IRR 1000ML POUR (IV SOLUTION) ×1 IMPLANT

## 2021-09-15 NOTE — Anesthesia Postprocedure Evaluation (Signed)
Anesthesia Post Note  Patient: Carol [name]  Procedure(s) Performed: INTRAMEDULLARY (IM) NAIL FEMORAL (Right: Hip)     Patient location during evaluation: PACU Anesthesia Type: General Level of consciousness: sedated and patient cooperative Pain management: pain level controlled Vital Signs Assessment: post-procedure vital signs reviewed and stable Respiratory status: spontaneous breathing Cardiovascular status: stable Anesthetic complications: no   No notable events documented.  Last Vitals:  Vitals:   09/15/21 1137 09/15/21 1200  BP:  (!) 99/58  Pulse: (!) 52 (!) 56  Resp: 14 15  Temp: 36.5 C   SpO2: 96% 98%    Last Pain:  Vitals:   09/15/21 1021  TempSrc:   PainSc: 0-No pain                 Nolon Nations

## 2021-09-15 NOTE — Anesthesia Procedure Notes (Signed)
Procedure Name: Intubation Date/Time: 09/15/2021 7:48 AM  Performed by: Leonor Liv, CRNAPre-anesthesia Checklist: Patient identified, Emergency Drugs available, Suction available and Patient being monitored Patient Re-evaluated:Patient Re-evaluated prior to induction Oxygen Delivery Method: Circle System Utilized Preoxygenation: Pre-oxygenation with 100% oxygen Induction Type: IV induction, Rapid sequence and Cricoid Pressure applied Laryngoscope Size: Mac and 3 Grade View: Grade I Tube type: Oral Number of attempts: 1 Airway Equipment and Method: Stylet Placement Confirmation: ETT inserted through vocal cords under direct vision, positive ETCO2 and breath sounds checked- equal and bilateral Secured at: 21 cm Tube secured with: Tape Dental Injury: Teeth and Oropharynx as per pre-operative assessment
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
## 2021-09-15 NOTE — Anesthesia Preprocedure Evaluation (Addendum)
Anesthesia Evaluation  Patient identified by MRN, date of birth, ID band Patient awake    Reviewed: Allergy & Precautions, NPO status , Patient's Chart, lab work & pertinent test results  Airway Mallampati: II  TM Distance: >3 FB Neck ROM: Full    Dental no notable dental hx. (+) Dental Advisory Given, Teeth Intact   Pulmonary neg pulmonary ROS,    Pulmonary exam normal breath sounds clear to auscultation       Cardiovascular negative cardio ROS Normal cardiovascular exam Rhythm:Regular Rate:Normal     Neuro/Psych    GI/Hepatic negative GI ROS, Neg liver ROS,   Endo/Other  negative endocrine ROS  Renal/GU negative Renal ROS     Musculoskeletal negative musculoskeletal ROS (+)   Abdominal   Peds  Hematology  (+) Blood dyscrasia, anemia ,   Anesthesia Other Findings   Reproductive/Obstetrics                            Anesthesia Physical Anesthesia Plan  ASA: 2  Anesthesia Plan: General   Post-op Pain Management: Tylenol PO (pre-op)* and Celebrex PO (pre-op)*   Induction: Intravenous, Rapid sequence and Cricoid pressure planned  PONV Risk Score and Plan: 4 or greater and Ondansetron, Dexamethasone, Treatment may vary due to age or medical condition and Midazolam  Airway Management Planned: Oral ETT  Additional Equipment:   Intra-op Plan:   Post-operative Plan: Extubation in OR  Informed Consent: I have reviewed the patients History and Physical, chart, labs and discussed the procedure including the risks, benefits and alternatives for the proposed anesthesia with the patient or authorized representative who has indicated his/her understanding and acceptance.     Dental advisory given  Plan Discussed with: CRNA  Anesthesia Plan Comments:        Anesthesia Quick Evaluation

## 2021-09-15 NOTE — Progress Notes (Signed)
[facility] consult progress note                                                                              Carol [name], is a 56 y.o. female, DOB - October 20, 1965, ELF:[id number] Admit date - 09/14/2021    Outpatient Primary MD for the patient is Ouida Sills Ocie Cornfield, MD  LOS - 1  days  Chief Complaint  Patient presents with   Fall       Brief summary   Patient is a 56 year old female with osteoporosis, admitted to orthopedic service with acute proximal femoral diaphyseal fracture after presenting from home after ground-level mechanical fall.  Patient was ambulating and she tripped, landing on her right side.  X-rays showed angulated mid/proximal femoral diaphyseal fracture.  Pelvic x-ray showed no acute fracture. CBC showed hemoglobin of 8, platelets 114, white count 5.4.  Creatinine 1.0 Patient was admitted by orthopedic service, [facility] medicine consulted for anemia  Assessment & Plan    Principal Problem:   Closed femur fracture ([place]) -Status post intramedullary nail surgery today - management per primary service, orthopedics   Active Problems:   Acute normochromic normocytic anemia -B12, folate normal, ferritin 71, percent saturation ratio 36, reticulocyte count normal -No complaints of bleeding, per patient never had issues with anemia before -Received 1 unit packed RBC transfusion, hemoglobin 12.8 today - will obtain UA, SPEP, UPEP to complete work-up inpatient -Discussed with patient and her husband in detail, if H&H remained stable, further work-up outpatient including repeating CBC, colonoscopy.  Previous colonoscopy 2017 with polyps, biopsy benign.  If negative, outpatient referral by PCP to hematology.  Patient agreeable with the plan   Code Status: Full code DVT Prophylaxis:  enoxaparin (LOVENOX) injection 40 mg Start: 09/16/21 1000 SCDs Start: 09/15/21 1015   Level of Care: Level of care: Med-Surg Family Communication: Updated patient's  husband at the bedside   Disposition Plan:      Remains inpatient appropriate: Per orthopedics   Procedures:  IM nailing    Medications  acetaminophen  1,000 mg Oral Q6H   calcium carbonate  2 tablet Oral Daily   cholecalciferol  1,000 Units Oral Daily   docusate sodium  100 mg Oral BID   [START ON 09/16/2021] enoxaparin (LOVENOX) injection  40 mg Subcutaneous Daily   HYDROmorphone       mupirocin ointment  1 Application Nasal BID   pantoprazole  40 mg Oral Daily      Subjective:   Carol [name] was seen and examined today.  No acute complaints, seen after surgery.  Feeling cold.  Patient denies dizziness, chest pain, shortness of breath, abdominal pain, N/V.  States no previous issues with anemia, Objective:   Vitals:   09/15/21 1001 09/15/21 1015 09/15/21 1017 09/15/21 1137  BP:   118/76   Pulse: 70     Resp: 17     Temp:    97.7 F (36.5 C)  TempSrc:      SpO2: 96% 98% 98%   Weight:      Height:        Intake/Output Summary (Last 24 hours) at 09/15/2021 1208 Last data filed  at 09/15/2021 1100 Gross per 24 hour  Intake 1200 ml  Output 50 ml  Net 1150 ml     Wt Readings from Last 3 Encounters:  09/15/21 63.1 kg  11/26/18 73.9 kg  11/25/18 61.8 kg     Exam General: Alert and oriented x 3, NAD Cardiovascular: S1 S2 auscultated,  RRR Respiratory: Clear to auscultation bilaterally Gastrointestinal: Soft, nontender, nondistended, + bowel sounds Ext: no pedal edema bilaterally Neuro: did not assess, seen post op. Psych: Normal affect and demeanor, alert and oriented x3     Data Reviewed:  I have personally reviewed following labs    CBC Lab Results  Component Value Date   WBC 7.5 09/15/2021   RBC 4.18 09/15/2021   RBC 4.20 09/15/2021   HGB 12.8 09/15/2021   HCT 37.5 09/15/2021   MCV 89.3 09/15/2021   MCH 30.5 09/15/2021   PLT 169 09/15/2021   MCHC 34.1 09/15/2021   RDW 14.1 09/15/2021   LYMPHSABS 1.0 09/14/2021   MONOABS 0.4  09/14/2021   EOSABS 0.1 09/14/2021   BASOSABS 0.0 50/09/3816     Last metabolic panel Lab Results  Component Value Date   NA 137 09/15/2021   K 3.9 09/15/2021   CL 105 09/15/2021   CO2 24 09/15/2021   BUN 14 09/15/2021   CREATININE 0.93 09/15/2021   GLUCOSE 100 (H) 09/15/2021   GFRNONAA >60 09/15/2021   CALCIUM 8.6 (L) 09/15/2021   PROT 6.3 (L) 09/14/2021   ALBUMIN 3.8 09/14/2021   BILITOT 0.6 09/14/2021   ALKPHOS 37 (L) 09/14/2021   AST 21 09/14/2021   ALT 15 09/14/2021   ANIONGAP 8 09/15/2021    CBG (last 3)  No results for input(s): "GLUCAP" in the last 72 hours.    Coagulation Profile: Recent Labs  Lab 09/15/21 0617  INR 1.1     Radiology Studies: I have personally reviewed the imaging studies  DG FEMUR, MIN 2 VIEWS RIGHT  Result Date: 09/15/2021 CLINICAL DATA:  [id number], ORIF EXAM: RIGHT FEMUR 2 VIEWS COMPARISON:  September 14, 2021 FINDINGS: Spot fluoroscopy images were obtained for surgical planning purposes. Fluoroscopic images demonstrate placement of a RIGHT femoral intramedullary rod. There is improved alignment of the mid femoral shaft fracture. There is some minimal persistent medial and posterior displacement of the distal fragment. Fluoro time 1 minutes 44 seconds; dose 5.61 mGy IMPRESSION: Spot fluoroscopy images were obtained for surgical planning purposes. Electronically Signed   By: Valentino Saxon M.D.   On: 09/15/2021 10:04   DG C-Arm 1-60 Min-No Report  Result Date: 09/15/2021 Fluoroscopy was utilized by the requesting physician.  No radiographic interpretation.   DG Pelvis Portable  Result Date: 09/14/2021 CLINICAL DATA:  Fall down bleachers. EXAM: PORTABLE PELVIS 1-2 VIEWS COMPARISON:  None Available. FINDINGS: There is no evidence of pelvic fracture or diastasis. No pelvic bone lesions are seen. Metallic butterfly is presumably external to the patient. IMPRESSION: Negative. Electronically Signed   By: San Morelle M.D.   On: 09/14/2021  20:08   DG Femur Portable Min 2 Views Right  Result Date: 09/14/2021 CLINICAL DATA:  Fall. EXAM: RIGHT FEMUR PORTABLE 2 VIEW COMPARISON:  None Available. FINDINGS: There is an acute transverse fracture through the mid/proximal femoral diaphysis. There is apex lateral angulation. There is surrounding soft tissue swelling. No dislocation. IMPRESSION: 1. Angulated mid/proximal femoral diaphyseal fracture. Electronically Signed   By: Ronney Asters M.D.   On: 09/14/2021 20:03       Chibuike Fleek M.D. [facility]  09/15/2021, 12:08 PM  Available via Epic secure chat 7am-7pm After 7 pm, please refer to night coverage provider listed on amion.

## 2021-09-15 NOTE — Discharge Instructions (Signed)

## 2021-09-15 NOTE — Interval H&P Note (Signed)
History and Physical Interval Note:  09/15/2021 7:32 AM  Carol [name]  has presented today for surgery, with the diagnosis of RIGHT FEMUR FRACTURE.  The various methods of treatment have been discussed with the patient and family. After consideration of risks, benefits and other options for treatment, the patient has consented to  Procedure(s): INTRAMEDULLARY (IM) NAIL FEMORAL (Right) as a surgical intervention.  The patient's history has been reviewed, patient examined, no change in status, stable for surgery.  I have reviewed the patient's chart and labs.  Questions were answered to the patient's satisfaction.     Renette Butters

## 2021-09-15 NOTE — Op Note (Signed)
DATE OF SURGERY:  09/15/2021  TIME: 9:01 AM  PATIENT NAME:  Carol [name]  AGE: 56 y.o.  PRE-OPERATIVE DIAGNOSIS:  RIGHT FEMUR FRACTURE  POST-OPERATIVE DIAGNOSIS:  SAME  PROCEDURE:  INTRAMEDULLARY (IM) NAIL FEMORAL  SURGEON:  Renette Butters  ASSISTANT:  Aggie Moats, PA-C, he was present and scrubbed throughout the case, critical for completion in a timely fashion, and for retraction, instrumentation, and closure.   OPERATIVE IMPLANTS: Stryker recon Nail  PREOPERATIVE INDICATIONS:  [name] is a 56 y.o. year old who fell and suffered a hip fracture. She was brought into the ER and then admitted and optimized and then elected for surgical intervention.    The risks benefits and alternatives were discussed with the patient including but not limited to the risks of nonoperative treatment, versus surgical intervention including infection, bleeding, nerve injury, malunion, nonunion, hardware prominence, hardware failure, need for hardware removal, blood clots, cardiopulmonary complications, morbidity, mortality, among others, and they were willing to proceed.    OPERATIVE PROCEDURE:  The patient was brought to the operating room and placed in the supine position. General anesthesia was administered. She was placed on the fracture table.  Closed reduction was performed under C-arm guidance. Time out was then performed after sterile prep and drape. She received preoperative antibiotics.  I first performed a closed manipulation of the fracture to reduce it on the OR table.  Incision was made proximal to the greater trochanter. A guidewire was placed in the appropriate position. Confirmation was made on AP and lateral views. The above-named nail was opened. I opened the proximal femur with a reamer. I then placed the nail by hand easily down. I did not need to ream the femur.  Once the nail was completely seated I removed traction to compress the fracture, I placed a  guidepins into the head in center center position. I measured the length, and then reamed the lateral cortex and up into the head. I then placed the lag screws. Slight compression was applied. Anatomic fixation achieved.  I then  took final C-arm pictures AP and lateral the entire length of the leg.  I then used perfect circles technique to place 2  distal interlock screws.   Anatomic reconstruction was achieved, and the wounds were irrigated copiously and closed with a layered closure. The patient was awakened and returned to PACU in stable and satisfactory condition. There no complications and the patient tolerated the procedure well.  She will be weightbearing as tolerated, and will be on chemical px  for a period of four weeks after discharge.   Edmonia Lynch, M.D.

## 2021-09-15 NOTE — Transfer of Care (Signed)
Immediate Anesthesia Transfer of Care Note  Patient: Carol [name]  Procedure(s) Performed: INTRAMEDULLARY (IM) NAIL FEMORAL (Right: Hip)  Patient Location: PACU  Anesthesia Type:General  Level of Consciousness: drowsy  Airway & Oxygen Therapy: Patient Spontanous Breathing and Patient connected to nasal cannula oxygen  Post-op Assessment: Report given to RN, Post -op Vital signs reviewed and stable and Patient moving all extremities  Post vital signs: Reviewed and stable  Last Vitals:  Vitals Value Taken Time  BP 108/75 09/15/21 0938  Temp 35.2 C 09/15/21 0938  Pulse 74 09/15/21 0944  Resp 14 09/15/21 0944  SpO2 88 % 09/15/21 0944  Vitals shown include unvalidated device data.  Last Pain:  Vitals:   09/15/21 0938  TempSrc:   PainSc: 0-No pain      Patients Stated Pain Goal: 2 (83/37/44 5146)  Complications: No notable events documented.

## 2021-09-16 DIAGNOSIS — D62 Acute posthemorrhagic anemia: Secondary | ICD-10-CM | POA: Diagnosis not present

## 2021-09-16 DIAGNOSIS — S72301A Unspecified fracture of shaft of right femur, initial encounter for closed fracture: Secondary | ICD-10-CM | POA: Diagnosis not present

## 2021-09-16 DIAGNOSIS — S72001A Fracture of unspecified part of neck of right femur, initial encounter for closed fracture: Secondary | ICD-10-CM | POA: Diagnosis not present

## 2021-09-16 DIAGNOSIS — D649 Anemia, unspecified: Secondary | ICD-10-CM | POA: Diagnosis not present

## 2021-09-16 LAB — VITAMIN D 25 HYDROXY (VIT D DEFICIENCY, FRACTURES): Vit D, 25-Hydroxy: 97.95 ng/mL (ref 30–100)

## 2021-09-16 LAB — CBC
HCT: 34.1 % — ABNORMAL LOW (ref 36.0–46.0)
Hemoglobin: 11.4 g/dL — ABNORMAL LOW (ref 12.0–15.0)
MCH: 30.1 pg (ref 26.0–34.0)
MCHC: 33.4 g/dL (ref 30.0–36.0)
MCV: 90 fL (ref 80.0–100.0)
Platelets: 138 10*3/uL — ABNORMAL LOW (ref 150–400)
RBC: 3.79 MIL/uL — ABNORMAL LOW (ref 3.87–5.11)
RDW: 14.2 % (ref 11.5–15.5)
WBC: 6 10*3/uL (ref 4.0–10.5)
nRBC: 0 % (ref 0.0–0.2)

## 2021-09-16 LAB — BASIC METABOLIC PANEL
Anion gap: 4 — ABNORMAL LOW (ref 5–15)
BUN: 11 mg/dL (ref 6–20)
CO2: 28 mmol/L (ref 22–32)
Calcium: 8.5 mg/dL — ABNORMAL LOW (ref 8.9–10.3)
Chloride: 104 mmol/L (ref 98–111)
Creatinine, Ser: 1.15 mg/dL — ABNORMAL HIGH (ref 0.44–1.00)
GFR, Estimated: 56 mL/min — ABNORMAL LOW (ref >60)
Glucose, Bld: 111 mg/dL — ABNORMAL HIGH (ref 70–99)
Potassium: 4.4 mmol/L (ref 3.5–5.1)
Sodium: 136 mmol/L (ref 135–145)

## 2021-09-16 MED ORDER — OXYCODONE HCL 5 MG PO TABS: ORAL_TABLET | ORAL | 0 refills | Status: DC

## 2021-09-16 MED ORDER — ASPIRIN 81 MG PO TBEC: 81.0000 mg | DELAYED_RELEASE_TABLET | Freq: Two times a day (BID) | ORAL | 0 refills | Status: AC

## 2021-09-16 MED ORDER — ACETAMINOPHEN 500 MG PO TABS: 1000.0000 mg | ORAL_TABLET | Freq: Four times a day (QID) | ORAL | 2 refills | Status: DC | PRN

## 2021-09-16 NOTE — Evaluation (Signed)
Physical Therapy Evaluation Patient Details Name: Carol [name] MRN: [id number] DOB: [birth year] Today's Date: 09/16/2021  History of Present Illness  Pt is a 56 y.o. F who presents 09/14/2021 with a fall and found to have acute right proximal femoral diaphyseal fracture now s/p IMN 09/15/2021. Significant PMH: none on file.  Clinical Impression  PTA, pt lives with her spouse and 57 y.o. son and works as a Radiation protection practitioner. Pt presents with fair pain control and is mobilizing well POD #1. Pt ambulating multiple bouts with RW, up to 150 ft, with pt brother providing chair follow. Pt utilizing step to pattern and cues provided for progressive weightbearing, sequencing, and technique. Practiced negotiating one step with walker to simulate home entrance. Written instruction provided for HEP to address further strengthening and ROM. Pt would benefit from HHPT to address deficits.     Recommendations for follow up therapy are one component of a multi-disciplinary discharge planning process, led by the attending physician.  Recommendations may be updated based on patient status, additional functional criteria and insurance authorization.  Follow Up Recommendations Home health PT      Assistance Recommended at Discharge PRN  Patient can return home with the following  A little help with walking and/or transfers;A little help with bathing/dressing/bathroom;Assistance with cooking/housework;Assist for transportation;Help with stairs or ramp for entrance    Equipment Recommendations Rolling walker (2 wheels)  Recommendations for Other Services       Functional Status Assessment Patient has had a recent decline in their functional status and demonstrates the ability to make significant improvements in function in a reasonable and predictable amount of time.     Precautions / Restrictions Precautions Precautions: Fall Restrictions Weight Bearing Restrictions: Yes RLE Weight Bearing: Weight  bearing as tolerated      Mobility  Bed Mobility Overal bed mobility: Modified Independent             General bed mobility comments: using arms to progress RLE off edge of bed    Transfers Overall transfer level: Modified independent Equipment used: Rolling walker (2 wheels)               General transfer comment: cues for hand placement    Ambulation/Gait Ambulation/Gait assistance: Supervision, Min guard Gait Distance (Feet): 150 Feet (30 ft, 30 ft, then additional 90 ft) Assistive device: Rolling walker (2 wheels) Gait Pattern/deviations: Step-to pattern, Decreased stance time - right, Decreased weight shift to right, Decreased dorsiflexion - right Gait velocity: decreased Gait velocity interpretation: <1.8 ft/sec, indicate of risk for recurrent falls   General Gait Details: Cues for sequencing/technique, walker use. Heavy reliance through arms on walker.  Stairs Stairs: Yes Stairs assistance: Min guard Stair Management: With walker, Backwards, Forwards Number of Stairs: 1 General stair comments: Pt ascended one step backwards with RW and descended forwards. cues for sequencing/technique  Wheelchair Mobility    Modified Rankin (Stroke Patients Only)       Balance Overall balance assessment: Needs assistance Sitting-balance support: Feet supported Sitting balance-Leahy Scale: Good     Standing balance support: Bilateral upper extremity supported Standing balance-Leahy Scale: Poor Standing balance comment: reliant on BUE                             Pertinent Vitals/Pain Pain Assessment Pain Assessment: Faces Faces Pain Scale: Hurts little more Pain Location: surgical site Pain Descriptors / Indicators: Discomfort, Grimacing, Guarding Pain Intervention(s): Monitored during session, Premedicated before session, Ice  applied    Home Living Family/patient expects to be discharged to:: Private residence Living Arrangements:  Spouse/significant other;Children (husband, 70 y.o. son) Available Help at Discharge: Family Type of Home: House Home Access: Stairs to enter   Technical brewer of Steps: 1 (1 step onto porch, 1 step into house)   Home Layout: Able to live on main level with bedroom/bathroom Home Equipment: Shower seat - built in      Prior Function Prior Level of Function : Independent/Modified Independent             Mobility CommentsCommunity education officer at [employer]        Extremity/Trunk Assessment   Upper Extremity Assessment Upper Extremity Assessment: Overall WFL for tasks assessed    Lower Extremity Assessment Lower Extremity Assessment: RLE deficits/detail RLE Deficits / Details: Grossly 2-/5 hip/knee strength, ankle WFL    Cervical / Trunk Assessment Cervical / Trunk Assessment: Normal  Communication   Communication: No difficulties  Cognition Arousal/Alertness: Awake/alert Behavior During Therapy: WFL for tasks assessed/performed Overall Cognitive Status: Within Functional Limits for tasks assessed                                          General Comments      Exercises     Assessment/Plan    PT Assessment Patient needs continued PT services  PT Problem List Decreased strength;Decreased activity tolerance;Decreased balance;Decreased mobility;Pain       PT Treatment Interventions DME instruction;Gait training;Functional mobility training;Stair training;Therapeutic activities;Therapeutic exercise;Balance training;Patient/family education    PT Goals (Current goals can be found in the Care Plan section)  Acute Rehab PT Goals Patient Stated Goal: go home PT Goal Formulation: With patient Time For Goal Achievement: 09/30/21 Potential to Achieve Goals: Good    Frequency Min 5X/week     Co-evaluation               AM-PAC PT "6 Clicks" Mobility  Outcome Measure Help needed turning from your back to your side while  in a flat bed without using bedrails?: None Help needed moving from lying on your back to sitting on the side of a flat bed without using bedrails?: None Help needed moving to and from a bed to a chair (including a wheelchair)?: A Little Help needed standing up from a chair using your arms (e.g., wheelchair or bedside chair)?: A Little Help needed to walk in hospital room?: A Little Help needed climbing 3-5 steps with a railing? : A Little 6 Click Score: 20    End of Session Equipment Utilized During Treatment: Gait belt Activity Tolerance: Patient tolerated treatment well Patient left: in chair;with call bell/phone within reach;with family/visitor present Nurse Communication: Mobility status PT Visit Diagnosis: Other abnormalities of gait and mobility (R26.89);Difficulty in walking, not elsewhere classified (R26.2);Pain Pain - Right/Left: Right Pain - part of body: Leg    Time: 6834-1962 PT Time Calculation (min) (ACUTE ONLY): 44 min   Charges:   PT Evaluation $PT Eval Low Complexity: 1 Low PT Treatments $Gait Training: 23-37 mins        Wyona Almas, PT, DPT Acute Rehabilitation Services Office [phone]   Deno Etienne 09/16/2021, 11:07 AM

## 2021-09-16 NOTE — Progress Notes (Signed)
[facility] consult progress note                                                                              Carol [name], is a 56 y.o. female, DOB - 1965-09-30, TGY:[id number] Admit date - 09/14/2021    Outpatient Primary MD for the patient is Ouida Sills Ocie Cornfield, MD  LOS - 2  days  Chief Complaint  Patient presents with   Fall       Brief summary   Patient is a 56 year old female with osteoporosis, admitted to orthopedic service with acute proximal femoral diaphyseal fracture after presenting from home after ground-level mechanical fall.  Patient was ambulating and she tripped, landing on her right side.  X-rays showed angulated mid/proximal femoral diaphyseal fracture.  Pelvic x-ray showed no acute fracture. CBC showed hemoglobin of 8, platelets 114, white count 5.4.  Creatinine 1.0 Patient was admitted by orthopedic service, [facility] medicine consulted for anemia  Assessment & Plan    Principal Problem:  Right Closed femur fracture () -Status post intramedullary nail surgery on 09/15/2021, postop day #1 - management per primary service, orthopedics   Active Problems:   Acute normochromic normocytic anemia -B12, folate normal, ferritin 71, percent saturation ratio 36, reticulocyte count normal -No complaints of bleeding, per patient never had issues with anemia before -Received 1 unit packed RBC transfusion on admission, H&H stable 11.4, -Work-up so far negative, UA did not show proteinuria, SPEP, UPEP pending -Discussed with patient and her husband in detail, if H&H remained stable, further work-up outpatient including repeating CBC, colonoscopy.  Previous colonoscopy 2017 with polyps, biopsy benign.  If negative, outpatient referral by PCP to hematology.  Patient agreeable with the plan   Code Status: Full code DVT Prophylaxis:  enoxaparin (LOVENOX) injection 40 mg Start: 09/16/21 1000 SCDs Start: 09/15/21 1015   Level of Care: Level of care:  Med-Surg Family Communication: Updated patient's husband at the bedside   Disposition Plan:      Remains inpatient appropriate: Per orthopedics   Procedures:  IM nailing    Medications  calcium carbonate  2 tablet Oral Daily   cholecalciferol  1,000 Units Oral Daily   docusate sodium  100 mg Oral BID   enoxaparin (LOVENOX) injection  40 mg Subcutaneous Daily   mupirocin ointment  1 Application Nasal BID   pantoprazole  40 mg Oral Daily      Subjective:   Carol [name] was seen and examined today.  No acute complaints, pain controlled. Patient denies dizziness, chest pain, shortness of breath, abdominal pain, N/V.  Objective:   Vitals:   09/15/21 1400 09/15/21 1522 09/15/21 1620 09/15/21 2029  BP: (!) 1'04/58 96/60 96/60 '$ (!) 89/59  Pulse: (!) 51  (!) 54   Resp: 12  15   Temp:   98.9 F (37.2 C) 99.1 F (37.3 C)  TempSrc:   Oral Oral  SpO2: 98%  96%   Weight:      Height:        Intake/Output Summary (Last 24 hours) at 09/16/2021 1039 Last data filed at 09/16/2021 0629 Gross per 24 hour  Intake 400 ml  Output 970  ml  Net -570 ml     Wt Readings from Last 3 Encounters:  09/15/21 63.1 kg  11/26/18 73.9 kg  11/25/18 61.8 kg    Physical Exam General: Alert and oriented x 3, NAD Cardiovascular: S1 S2 clear, RRR.  Respiratory: CTAB, no wheezing, rales  Gastrointestinal: Soft, nontender, nondistended, NBS Ext: no pedal edema bilaterally Neuro: no new deficits Psych: Normal affect and demeanor, alert and oriented x3     Data Reviewed:  I have personally reviewed following labs    CBC Lab Results  Component Value Date   WBC 6.0 09/16/2021   RBC 3.79 (L) 09/16/2021   HGB 11.4 (L) 09/16/2021   HCT 34.1 (L) 09/16/2021   MCV 90.0 09/16/2021   MCH 30.1 09/16/2021   PLT 138 (L) 09/16/2021   MCHC 33.4 09/16/2021   RDW 14.2 09/16/2021   LYMPHSABS 1.0 09/14/2021   MONOABS 0.4 09/14/2021   EOSABS 0.1 09/14/2021   BASOSABS 0.0 09/14/2021     Last  metabolic panel Lab Results  Component Value Date   NA 136 09/16/2021   K 4.4 09/16/2021   CL 104 09/16/2021   CO2 28 09/16/2021   BUN 11 09/16/2021   CREATININE 1.15 (H) 09/16/2021   GLUCOSE 111 (H) 09/16/2021   GFRNONAA 56 (L) 09/16/2021   CALCIUM 8.5 (L) 09/16/2021   PROT 6.3 (L) 09/14/2021   ALBUMIN 3.8 09/14/2021   BILITOT 0.6 09/14/2021   ALKPHOS 37 (L) 09/14/2021   AST 21 09/14/2021   ALT 15 09/14/2021   ANIONGAP 4 (L) 09/16/2021    CBG (last 3)  No results for input(s): "GLUCAP" in the last 72 hours.    Coagulation Profile: Recent Labs  Lab 09/15/21 0617  INR 1.1     Radiology Studies: I have personally reviewed the imaging studies  DG FEMUR, MIN 2 VIEWS RIGHT  Result Date: 09/15/2021 CLINICAL DATA:  [id number], ORIF EXAM: RIGHT FEMUR 2 VIEWS COMPARISON:  September 14, 2021 FINDINGS: Spot fluoroscopy images were obtained for surgical planning purposes. Fluoroscopic images demonstrate placement of a RIGHT femoral intramedullary rod. There is improved alignment of the mid femoral shaft fracture. There is some minimal persistent medial and posterior displacement of the distal fragment. Fluoro time 1 minutes 44 seconds; dose 5.61 mGy IMPRESSION: Spot fluoroscopy images were obtained for surgical planning purposes. Electronically Signed   By: Valentino Saxon M.D.   On: 09/15/2021 10:04   DG C-Arm 1-60 Min-No Report  Result Date: 09/15/2021 Fluoroscopy was utilized by the requesting physician.  No radiographic interpretation.   DG Pelvis Portable  Result Date: 09/14/2021 CLINICAL DATA:  Fall down bleachers. EXAM: PORTABLE PELVIS 1-2 VIEWS COMPARISON:  None Available. FINDINGS: There is no evidence of pelvic fracture or diastasis. No pelvic bone lesions are seen. Metallic butterfly is presumably external to the patient. IMPRESSION: Negative. Electronically Signed   By: San Morelle M.D.   On: 09/14/2021 20:08   DG Femur Portable Min 2 Views Right  Result Date:  09/14/2021 CLINICAL DATA:  Fall. EXAM: RIGHT FEMUR PORTABLE 2 VIEW COMPARISON:  None Available. FINDINGS: There is an acute transverse fracture through the mid/proximal femoral diaphysis. There is apex lateral angulation. There is surrounding soft tissue swelling. No dislocation. IMPRESSION: 1. Angulated mid/proximal femoral diaphyseal fracture. Electronically Signed   By: Ronney Asters M.D.   On: 09/14/2021 20:03       Trayvond Viets M.D. [facility] 09/16/2021, 10:39 AM  Available via Epic secure chat 7am-7pm After 7 pm, please refer to  night coverage provider listed on amion.

## 2021-09-16 NOTE — TOC Transition Note (Addendum)
Transition of Care [facility]) - CM/SW Discharge Note   Patient Details  Name: Carol [name] MRN: [id number] Date of Birth: [birth year]  [facility] of [facility]) CM/SW Contact:  Bartholomew Crews, RN Phone Number: [phone] 09/16/2021, 11:19 AM   Clinical Narrative:     Patient to transition home today. Spoke with patient and her brother at the bedside. Husband to provide transportation home in private vehicle. RW to be delivered to bedside. [facility] PT referral pending acceptance. Patient stated she will start with [facility], then transition to outpatient PT in [place].   UPDATE: HH PT referral accepted by [facility]. Patient notified. Patient stated that she really does not want bedside commode and has something at home that will help her to get up.   Final next level of care: [place] Barriers to Discharge: No Barriers Identified   Patient Goals and CMS Choice Patient states their goals for this hospitalization and ongoing recovery are:: home with husband to get [facility] PT then outpt PT in [facility] CMS Medicare.gov Compare Post Acute Care list provided to:: Patient Choice offered to / list presented to : Patient  Discharge Placement                       Discharge Plan and Services                DME Arranged: Walker rolling DME Agency: [employer] Date DME Agency Contacted: 09/16/21   Representative spoke with at DME Agency: Mardene Celeste [facility] Arranged: PT          Social Determinants of Health ([place]) Interventions     Readmission Risk Interventions     No data to display

## 2021-09-16 NOTE — Plan of Care (Signed)

## 2021-09-16 NOTE — Evaluation (Signed)
Occupational Therapy Evaluation Patient Details Name: Carol [name] MRN: [id number] DOB: 08/17/1965 Today's Date: 09/16/2021   History of Present Illness Pt is a 56 y.o. F who presents 09/14/2021 with a fall and found to have acute right proximal femoral diaphyseal fracture now s/p IMN 09/15/2021. Significant PMH: none on file.   Clinical Impression   Carol [name] was evaluated s/p the above admission list, she is indep at baseline without history of falls with exception of this tripping accident. Upon evaluation she required minA for LB ADLs. Reviewed compensatory technique education for dressing, bathing, toileting and pain management, she verbalized great understanding. OT to continue to follow acutely without need for follow up OT.      Recommendations for follow up therapy are one component of a multi-disciplinary discharge planning process, led by the attending physician.  Recommendations may be updated based on patient status, additional functional criteria and insurance authorization.   Follow Up Recommendations  No OT follow up    Assistance Recommended at Discharge Intermittent Supervision/Assistance  Patient can return home with the following A little help with walking and/or transfers;A little help with bathing/dressing/bathroom;Assistance with cooking/housework;Assist for transportation;Help with stairs or ramp for entrance    Functional Status Assessment  Patient has had a recent decline in their functional status and demonstrates the ability to make significant improvements in function in a reasonable and predictable amount of time.  Equipment Recommendations  BSC/3in1;Other (comment) (RW)    Recommendations for Other Services       Precautions / Restrictions Precautions Precautions: Fall Precaution Comments: hx of syncope Restrictions Weight Bearing Restrictions: Yes RLE Weight Bearing: Weight bearing as tolerated      Mobility Bed Mobility                General bed mobility comments: OOB in chair    Transfers Overall transfer level: Modified independent Equipment used: Rolling walker (2 wheels)                      Balance Overall balance assessment: Needs assistance Sitting-balance support: Feet supported Sitting balance-Leahy Scale: Good       Standing balance-Leahy Scale: Poor Standing balance comment: reliant on BUE                           ADL either performed or assessed with clinical judgement   ADL Overall ADL's : Needs assistance/impaired Eating/Feeding: Independent;Sitting   Grooming: Min guard;Standing   Upper Body Bathing: Set up;Sitting   Lower Body Bathing: Minimal assistance;Sit to/from stand   Upper Body Dressing : Set up;Sitting   Lower Body Dressing: Minimal assistance;Sit to/from stand   Toilet Transfer: Agricultural engineer (2 wheels)   Toileting- Clothing Manipulation and Hygiene: Supervision/safety;Sitting/lateral lean       Functional mobility during ADLs: Min guard;Rolling walker (2 wheels) General ADL Comments: educated on compensatory techniques, shower chair, BSC over toilet, adn R leg dressing     Vision Baseline Vision/History: 0 No visual deficits Vision Assessment?: No apparent visual deficits     Perception     Praxis      Pertinent Vitals/Pain Pain Assessment Pain Assessment: Faces Faces Pain Scale: Hurts little more Pain Location: surgical site Pain Descriptors / Indicators: Discomfort, Grimacing, Guarding Pain Intervention(s): Limited activity within patient's tolerance, Monitored during session     Hand Dominance Right   Extremity/Trunk Assessment Upper Extremity Assessment Upper Extremity Assessment: Overall WFL for tasks assessed   Lower  Extremity Assessment Lower Extremity Assessment: Defer to PT evaluation RLE Deficits / Details: Grossly 2-/5 hip/knee strength, ankle WFL   Cervical / Trunk Assessment Cervical / Trunk Assessment:  Normal   Communication Communication Communication: No difficulties   Cognition Arousal/Alertness: Awake/alert Behavior During Therapy: WFL for tasks assessed/performed Overall Cognitive Status: Within Functional Limits for tasks assessed                                       General Comments  VSS on RA, BP low at baseline. husband present ans supportive    Exercises     Shoulder Instructions      Home Living Family/patient expects to be discharged to:: Private residence Living Arrangements: Spouse/significant other;Children Available Help at Discharge: Family Type of Home: House Home Access: Stairs to enter Technical brewer of Steps: 1   Home Layout: Able to live on main level with bedroom/bathroom     Bathroom Shower/Tub: Walk-in shower         Home Equipment: Shower seat - built in          Prior Functioning/Environment Prior Level of Function : Independent/Modified Independent             Mobility CommentsCommunity education officer at [place] List: Decreased range of motion;Decreased strength;Decreased activity tolerance;Impaired balance (sitting and/or standing);Pain      OT Treatment/Interventions: Self-care/ADL training;Therapeutic exercise;DME and/or AE instruction;Patient/family education;Balance training    OT Goals(Current goals can be found in the care plan section) Acute Rehab OT Goals Patient Stated Goal: home OT Goal Formulation: With patient Time For Goal Achievement: 09/30/21 Potential to Achieve Goals: Good  OT Frequency: Min 2X/week    Co-evaluation              AM-PAC OT "6 Clicks" Daily Activity     Outcome Measure Help from another person eating meals?: None Help from another person taking care of personal grooming?: A Little Help from another person toileting, which includes using toliet, bedpan, or urinal?: A Little Help from another person bathing (including washing, rinsing, drying)?: A  Little Help from another person to put on and taking off regular upper body clothing?: None Help from another person to put on and taking off regular lower body clothing?: A Little 6 Click Score: 20   End of Session Equipment Utilized During Treatment: Gait belt Nurse Communication: Mobility status  Activity Tolerance: Patient tolerated treatment well Patient left: with call bell/phone within reach;in chair;with family/visitor present  OT Visit Diagnosis: Unsteadiness on feet (R26.81);Other abnormalities of gait and mobility (R26.89);Pain                Time: 7371-0626 OT Time Calculation (min): 27 min Charges:  OT General Charges $OT Visit: 1 Visit OT Evaluation $OT Eval Moderate Complexity: 1 Mod OT Treatments $Self Care/Home Management : 8-22 mins    Jozelynn Danielson A Adonna Horsley 09/16/2021, 12:33 PM

## 2021-09-16 NOTE — Progress Notes (Signed)
Subjective: 1 Day Post-Op Procedure(s) (LRB): INTRAMEDULLARY (IM) NAIL FEMORAL (Right) Patient reports pain as mild.    Objective: Vital signs in last 24 hours: Temp:  [97.7 F (36.5 C)-99.1 F (37.3 C)] 99.1 F (37.3 C) (08/26 2029) Pulse Rate:  [51-56] 54 (08/26 1620) Resp:  [12-15] 15 (08/26 1620) BP: (89-104)/(58-60) 89/59 (08/26 2029) SpO2:  [96 %-98 %] 96 % (08/26 1620)  Intake/Output from previous day: 08/26 0701 - 08/27 0700 In: 1400 [I.V.:1000; IV Piggyback:400] Out: 1020 [Urine:970; Blood:50] Intake/Output this shift: No intake/output data recorded.  Recent Labs    09/14/21 2005 09/15/21 0617 09/16/21 0110  HGB 8.0* 12.8 11.4*   Recent Labs    09/15/21 0617 09/16/21 0110  WBC 7.5 6.0  RBC 4.20  4.18 3.79*  HCT 37.5 34.1*  PLT 169 138*   Recent Labs    09/15/21 0617 09/16/21 0110  NA 137 136  K 3.9 4.4  CL 105 104  CO2 24 28  BUN 14 11  CREATININE 0.93 1.15*  GLUCOSE 100* 111*  CALCIUM 8.6* 8.5*   Recent Labs    09/15/21 0617  INR 1.1    Neurovascular intact Sensation intact distally Intact pulses distally Dorsiflexion/Plantar flexion intact Incision: dressing C/D/I No cellulitis present Compartment soft   Assessment/Plan: 1 Day Post-Op Procedure(s) (LRB): INTRAMEDULLARY (IM) NAIL FEMORAL (Right) Up with therapy WBAT RLE Pain control as ordered Asa dvt proph with d/c D/c home today once HHPT and walker arranged F/u Dr. Percell Miller 2 weeks     Chriss Czar 09/16/2021, 10:59 AM

## 2021-09-16 NOTE — Discharge Summary (Signed)
PATIENT ID: Carol [name]        MRN:  [id number]          DOB/AGE: Sep 25, 1965 / 56 y.o.    DISCHARGE SUMMARY  ADMISSION DATE:    09/14/2021 DISCHARGE DATE:   09/16/2021   ADMISSION DIAGNOSIS: Closed femur fracture ([place]) [S72.90XA]    DISCHARGE DIAGNOSIS:  RIGHT FEMUR FRACTURE    ADDITIONAL DIAGNOSIS: Principal Problem:   Closed femur fracture ([facility]) Active Problems:   Acute anemia  Past Medical History:  Diagnosis Date   Hirschsprung's disease    History of kidney stones    Neuromuscular disorder ([place])    Osteoporosis     PROCEDURE: Procedure(s): INTRAMEDULLARY (IM) NAIL FEMORAL Right on 09/15/2021  CONSULTS: PT/OT    HISTORY:  See H&P in chart  HOSPITAL COURSE:  Carol [name] is a 56 y.o. admitted on 09/14/2021 and found to have a diagnosis of [place].  After appropriate laboratory studies were obtained  they were taken to the operating room on 09/15/2021 and underwent  Procedure(s): INTRAMEDULLARY (IM) NAIL FEMORAL  Right.   They were given perioperative antibiotics:  Anti-infectives (From admission, onward)    Start     Dose/Rate Route Frequency Ordered Stop   09/15/21 1400  ceFAZolin (ANCEF) IVPB 2g/100 mL premix        2 g 200 mL/hr over 30 Minutes Intravenous Every 6 hours 09/15/21 1015 09/16/21 0125   09/15/21 0715  ceFAZolin (ANCEF) IVPB 2g/100 mL premix        2 g 200 mL/hr over 30 Minutes Intravenous On call to O.R. 09/15/21 0947 09/15/21 0755     .  Tolerated the procedure well.    POD #1, allowed out of bed to a chair.  PT for ambulation and exercise program. IV saline locked.  O2 discontionued.  The remainder of the hospital course was dedicated to ambulation and strengthening.   The patient was discharged on 1 Day Post-Op in  Stable condition.  Blood products given: 1 unit PRBC preop  DIAGNOSTIC STUDIES: Recent vital signs: Patient Vitals for the past 24 hrs:  BP Temp Temp src Pulse Resp SpO2  09/15/21 04-28-2027 (!) 89/59  99.1 F (37.3 C) Oral -- -- --  09/15/21 1620 96/60 98.9 F (37.2 C) Oral (!) 54 15 96 %  09/15/21 1522 96/60 -- -- -- -- --  09/15/21 1400 (!) 104/58 -- -- (!) 51 12 98 %  09/15/21 1200 (!) 99/58 -- -- (!) 56 15 98 %  09/15/21 1137 -- 97.7 F (36.5 C) -- (!) 52 14 96 %       Recent laboratory studies: Recent Labs    09/14/21 2003-04-28 09/15/21 0617 09/16/21 0110  WBC 5.4 7.5 6.0  HGB 8.0* 12.8 11.4*  HCT 24.5* 37.5 34.1*  PLT 114* 169 138*   Recent Labs    09/14/21 April 28, 2003 09/15/21 0617 09/16/21 0110  NA 140 137 136  K 4.6 3.9 4.4  CL 105 105 104  CO2 '26 24 28  '$ BUN '16 14 11  '$ CREATININE 1.02* 0.93 1.15*  GLUCOSE 106* 100* 111*  CALCIUM 9.0 8.6* 8.5*   Lab Results  Component Value Date   INR 1.1 09/15/2021     Recent Radiographic Studies :  DG FEMUR, MIN 2 VIEWS RIGHT  Result Date: 09/15/2021 CLINICAL DATA:  [id number], ORIF EXAM: RIGHT FEMUR 2 VIEWS COMPARISON:  September 14, 2021 FINDINGS: Spot fluoroscopy images were obtained for surgical planning purposes. Fluoroscopic images demonstrate placement of  a RIGHT femoral intramedullary rod. There is improved alignment of the mid femoral shaft fracture. There is some minimal persistent medial and posterior displacement of the distal fragment. Fluoro time 1 minutes 44 seconds; dose 5.61 mGy IMPRESSION: Spot fluoroscopy images were obtained for surgical planning purposes. Electronically Signed   By: Valentino Saxon M.D.   On: 09/15/2021 10:04   DG C-Arm 1-60 Min-No Report  Result Date: 09/15/2021 Fluoroscopy was utilized by the requesting physician.  No radiographic interpretation.   DG Pelvis Portable  Result Date: 09/14/2021 CLINICAL DATA:  Fall down bleachers. EXAM: PORTABLE PELVIS 1-2 VIEWS COMPARISON:  None Available. FINDINGS: There is no evidence of pelvic fracture or diastasis. No pelvic bone lesions are seen. Metallic butterfly is presumably external to the patient. IMPRESSION: Negative. Electronically Signed   By:  San Morelle M.D.   On: 09/14/2021 20:08   DG Femur Portable Min 2 Views Right  Result Date: 09/14/2021 CLINICAL DATA:  Fall. EXAM: RIGHT FEMUR PORTABLE 2 VIEW COMPARISON:  None Available. FINDINGS: There is an acute transverse fracture through the mid/proximal femoral diaphysis. There is apex lateral angulation. There is surrounding soft tissue swelling. No dislocation. IMPRESSION: 1. Angulated mid/proximal femoral diaphyseal fracture. Electronically Signed   By: Ronney Asters M.D.   On: 09/14/2021 20:03   MM 3D SCREEN BREAST BILATERAL  Result Date: 09/07/2021 CLINICAL DATA:  Screening. EXAM: DIGITAL SCREENING BILATERAL MAMMOGRAM WITH TOMOSYNTHESIS AND CAD TECHNIQUE: Bilateral screening digital craniocaudal and mediolateral oblique mammograms were obtained. Bilateral screening digital breast tomosynthesis was performed. The images were evaluated with computer-aided detection. COMPARISON:  Previous exam(s). ACR Breast Density Category c: The breast tissue is heterogeneously dense, which may obscure small masses. FINDINGS: There are no findings suspicious for malignancy. IMPRESSION: No mammographic evidence of malignancy. A result letter of this screening mammogram will be mailed directly to the patient. RECOMMENDATION: Screening mammogram in one year. (Code:SM-B-01Y) BI-RADS CATEGORY  1: Negative. Electronically Signed   By: Ammie Ferrier M.D.   On: 09/07/2021 11:46    DISCHARGE INSTRUCTIONS: Discharge Instructions     Ambulatory referral to [place]   Complete by: As directed    Please evaluate Carol [name] for admission to [facility].  Disciplines requested: Physical Therapy  Services to provide: Strengthening Exercises  Physician to follow patient's care (the person listed here will be responsible for signing ongoing orders): Referring Provider  Requested Start of Care Date: Tomorrow  I certify that this patient is under my care and that I, or a Nurse Practitioner  or Physician's Assistant working with me, had a face-to-face encounter that meets the physician face-to-face requirements with patient on 09/16/21. The encounter with the patient was in whole, or in part for the following medical condition(s) which is the primary reason for home health care (List medical condition). R femur fracture s/p IMN  Special Instructions:  WBAT RLE   Does the patient have Medicare or Medicaid?: No   The encounter with the patient was in whole, or in part, for the following medical condition, which is the primary reason for home health care: R femur fracture s/p IMN   Reason for Medically Necessary Home Health Services: Therapy- Therapeutic Exercises to Increase Strength and Endurance   My clinical findings support the need for the above services: Pain interferes with ambulation/mobility   I certify that, based on my findings, the following services are medically necessary home health services: Physical therapy   Further, I certify that my clinical findings support that this  patient is homebound due to: Unable to leave home safely without assistance   Walker rolling   Complete by: As directed        DISCHARGE MEDICATIONS:   Allergies as of 09/16/2021       Reactions   Morphine And Related Itching        Medication List     TAKE these medications    acetaminophen 500 MG tablet Commonly known as: TYLENOL Take 2 tablets (1,000 mg total) by mouth every 6 (six) hours as needed.   aspirin EC 81 MG tablet Take 1 tablet (81 mg total) by mouth 2 (two) times daily. TO PREVENT BLOOD CLOTS   calcium carbonate 600 MG Tabs tablet Commonly known as: OS-CAL Take 1,200 mg by mouth daily.   Cholecalciferol 25 MCG (1000 UT) tablet Take 1,000 Units by mouth daily.   oxyCODONE 5 MG immediate release tablet Commonly known as: Oxy IR/ROXICODONE Take one tab po q4-6hrs prn pain        FOLLOW UP VISIT:    Follow-up Information     Renette Butters, MD. Schedule an  appointment as soon as possible for a visit in 2 week(s).   Specialty: Orthopedic Surgery Contact information: [address] [place] [address] [phone]                 DISPOSITION:   Home  CONDITION:  Stable   Chriss Czar, PA-C  09/16/2021 11:02 AM

## 2021-09-18 LAB — PROTEIN ELECTROPHORESIS, SERUM
A/G Ratio: 1.5 (ref 0.7–1.7)
Albumin ELP: 3.2 g/dL (ref 2.9–4.4)
Alpha-1-Globulin: 0.2 g/dL (ref 0.0–0.4)
Alpha-2-Globulin: 0.4 g/dL (ref 0.4–1.0)
Beta Globulin: 0.8 g/dL (ref 0.7–1.3)
Gamma Globulin: 0.9 g/dL (ref 0.4–1.8)
Globulin, Total: 2.2 g/dL (ref 2.2–3.9)
Total Protein ELP: 5.4 g/dL — ABNORMAL LOW (ref 6.0–8.5)

## 2021-09-19 ENCOUNTER — Encounter (HOSPITAL_COMMUNITY): Payer: Self-pay | Admitting: Orthopedic Surgery

## 2022-02-04 ENCOUNTER — Ambulatory Visit: Payer: Self-pay | Admitting: Urology

## 2022-02-05 ENCOUNTER — Ambulatory Visit: Payer: Federal, State, Local not specified - PPO | Admitting: Urology

## 2022-02-05 ENCOUNTER — Encounter: Payer: Self-pay | Admitting: Urology

## 2022-02-05 ENCOUNTER — Ambulatory Visit
Admission: RE | Admit: 2022-02-05 | Discharge: 2022-02-05 | Disposition: A | Discharge status: Home / Self Care | Payer: Federal, State, Local not specified - PPO | Attending: Urology | Admitting: Urology

## 2022-02-05 ENCOUNTER — Other Ambulatory Visit: Payer: Self-pay

## 2022-02-05 ENCOUNTER — Ambulatory Visit
Admission: RE | Admit: 2022-02-05 | Discharge: 2022-02-05 | Disposition: A | Discharge status: Home / Self Care | Payer: Federal, State, Local not specified - PPO | Source: Ambulatory Visit | Attending: Urology | Admitting: Urology

## 2022-02-05 ENCOUNTER — Other Ambulatory Visit
Admission: RE | Admit: 2022-02-05 | Discharge: 2022-02-05 | Disposition: A | Discharge status: Home / Self Care | Payer: Federal, State, Local not specified - PPO | Source: Home / Self Care | Attending: Urology | Admitting: Urology

## 2022-02-05 VITALS — BP 101/67 | HR 78 | Ht 67.0 in | Wt 142.0 lb

## 2022-02-05 DIAGNOSIS — N3000 Acute cystitis without hematuria: Secondary | ICD-10-CM | POA: Insufficient documentation

## 2022-02-05 DIAGNOSIS — N390 Urinary tract infection, site not specified: Secondary | ICD-10-CM

## 2022-02-05 DIAGNOSIS — Z87442 Personal history of urinary calculi: Secondary | ICD-10-CM

## 2022-02-05 DIAGNOSIS — N200 Calculus of kidney: Secondary | ICD-10-CM

## 2022-02-05 DIAGNOSIS — Z87440 Personal history of urinary (tract) infections: Secondary | ICD-10-CM | POA: Diagnosis not present

## 2022-02-05 LAB — URINALYSIS, COMPLETE (UACMP) WITH MICROSCOPIC
Bacteria, UA: NONE SEEN
Bilirubin Urine: NEGATIVE
Glucose, UA: NEGATIVE mg/dL
Hgb urine dipstick: NEGATIVE
Ketones, ur: NEGATIVE mg/dL
Leukocytes,Ua: NEGATIVE
Nitrite: NEGATIVE
Protein, ur: NEGATIVE mg/dL
Specific Gravity, Urine: 1.025 (ref 1.005–1.030)
pH: 5 (ref 5.0–8.0)

## 2022-02-05 MED ORDER — NITROFURANTOIN MONOHYD MACRO 100 MG PO CAPS: 100.0000 mg | ORAL_CAPSULE | Freq: Every day | ORAL | 0 refills | Status: DC

## 2022-02-05 MED ORDER — ESTRADIOL 0.1 MG/GM VA CREA: TOPICAL_CREAM | VAGINAL | 12 refills | Status: AC

## 2022-02-05 NOTE — Progress Notes (Signed)
02/05/22 4:09 PM   Carol [name] 1965/10/29 [id number]  CC: Recurrent UTI, nephrolithiasis  HPI: 57 year old female prior patient of Carol [name] here to establish care for the above issues.  Recently was treated for an E. coli UTI and her acute symptoms of urinary frequency and foul-smelling urine have since resolved.  Her primary complaint today is 4-5 UTIs per year over the last few years.  She also has a history of nephrolithiasis requiring ureteroscopy by Carol [name] in 2020.  She denies any flank pain or gross hematuria at this time.  She does have some pain with sexual activity.   PMH: Past Medical History:  Diagnosis Date   Hirschsprung's disease    History of kidney stones    Neuromuscular disorder ([place])    Osteoporosis     Surgical History: Past Surgical History:  Procedure Laterality Date   COLONOSCOPY WITH PROPOFOL N/A 05/26/2015   Procedure: COLONOSCOPY WITH PROPOFOL;  Surgeon: Carol Silvas, MD;  Location: [place];  Service: Endoscopy;  Laterality: N/A;   CYSTOSCOPY W/ RETROGRADES Left 11/26/2018   Procedure: CYSTOSCOPY WITH RETROGRADE PYELOGRAM;  Surgeon: Carol Cowper, MD;  Location: [facility];  Service: Urology;  Laterality: Left;   CYSTOSCOPY WITH STENT PLACEMENT Left 11/26/2018   Procedure: CYSTOSCOPY WITH STENT PLACEMENT;  Surgeon: Carol Cowper, MD;  Location: [facility];  Service: Urology;  Laterality: Left;   FEMUR IM NAIL Right 09/15/2021   Procedure: INTRAMEDULLARY (IM) NAIL FEMORAL;  Surgeon: Carol Butters, MD;  Location: [place];  Service: Orthopedics;  Laterality: Right;   KNEE SURGERY     MYOMECTOMY     SALPINGECTOMY Left    SKIN GRAFT SPLIT THICKNESS LEG / FOOT Bilateral    URETEROSCOPY WITH HOLMIUM LASER LITHOTRIPSY Left 11/26/2018   Procedure: URETEROSCOPY WITH HOLMIUM LASER LITHOTRIPSY;  Surgeon: Carol Cowper, MD;  Location: [facility];  Service: Urology;  Laterality: Left;    Family History: Family History  Problem Relation Age  of Onset   Breast cancer Mother 68    Social History:  reports that she has never smoked. She has never used smokeless tobacco. She reports that she does not drink alcohol and does not use drugs.  Physical Exam: BP 101/67 (BP Location: Left Arm, Patient Position: Sitting, Cuff Size: Normal)   Pulse 78   Ht '5\' 7"'$  (1.702 m)   Wt 142 lb (64.4 kg)   BMI 22.24 kg/m    Constitutional:  Alert and oriented, No acute distress. Cardiovascular: No clubbing, cyanosis, or edema. Respiratory: Normal respiratory effort, no increased work of breathing. GI: Abdomen is soft, nontender, nondistended, no abdominal masses   Laboratory Data: Reviewed, see HPI   Assessment & Plan:   57 year old female with culture documented recurrent UTI over the last few years as well as a history of nephrolithiasis.  We discussed the evaluation and treatment of patients with recurrent UTIs at length.  We specifically discussed the differences between asymptomatic bacteriuria and true urinary tract infection.  We discussed the AUA definition of recurrent UTI of at least 2 culture proven symptomatic acute cystitis episodes in a 60-monthperiod, or 3 within a 1 year period.  We discussed the importance of culture directed antibiotic treatment, and antibiotic stewardship.  First-line therapy includes nitrofurantoin(5 days), Bactrim(3 days), or fosfomycin(3 g single dose).  Possible etiologies of recurrent infection include periurethral tissue atrophy in postmenopausal woman, constipation, sexual activity, incomplete emptying, anatomic abnormalities, and even genetic predisposition.  Finally, we discussed the role  of perineal hygiene, timed voiding, adequate hydration, topical vaginal estrogen, cranberry prophylaxis, and [name]-dose antibiotic prophylaxis.  -Trial of topical estrogen cream, 90 days nitrofurantoin [name]-dose prophylaxis, and cranberry tablets for recurrent infections -KUB today to evaluate for nephrolithiasis as nidus  of infection  Carol Madrid, MD 02/05/2022  [facility] [address] [place], [place] [address] [phone]

## 2022-02-05 NOTE — Patient Instructions (Addendum)
Understanding Genitourinary Syndrome of Menopause (GSM) and Recurrent UTIs  Genitourinary Syndrome of Menopause (GSM): Genitourinary Syndrome of Menopause refers to a collection of symptoms resulting from hormonal changes during menopause that affect the genital and urinary areas. It includes symptoms such as vaginal dryness, itching, burning, pain during intercourse, and recurrent UTI. The decline in estrogen levels is a primary contributor to these changes.   Managing GSM:  Topical Estrogen Therapy: Your healthcare provider may recommend topical estrogen products, like creams or rings, to help alleviate symptoms. Moisturizers and Lubricants: Over-the-counter moisturizers and lubricants can be used to relieve dryness and improve comfort during intercourse.  Recurrent UTIs are infections that occur frequently, often more than two times in six months or three times in a year. They are more common in postmenopausal women due to changes in the urinary tract and hormonal fluctuations.  Preventing Recurrent UTIs:  Stay Hydrated: Drinking plenty of water helps flush bacteria out of the urinary tract. Urinate Regularly: Don't hold urine for extended periods; emptying the bladder regularly can prevent bacterial growth. Maintain Good Hygiene: Wipe from front to back after using the toilet to prevent bacteria from the anal area entering the urethra. Topical estrogen cream is the best medication to help prevent recurrent infections Cranberry tablets taken twice daily can help change the pH of the urine and help prevent infections

## 2022-02-18 ENCOUNTER — Other Ambulatory Visit: Payer: Self-pay

## 2022-02-18 DIAGNOSIS — N200 Calculus of kidney: Secondary | ICD-10-CM | POA: Insufficient documentation

## 2022-02-18 DIAGNOSIS — R109 Unspecified abdominal pain: Secondary | ICD-10-CM

## 2022-02-21 ENCOUNTER — Ambulatory Visit
Admission: RE | Admit: 2022-02-21 | Discharge: 2022-02-21 | Disposition: A | Discharge status: Home / Self Care | Payer: Federal, State, Local not specified - PPO | Source: Ambulatory Visit | Attending: Urology | Admitting: Urology

## 2022-02-21 DIAGNOSIS — R109 Unspecified abdominal pain: Secondary | ICD-10-CM

## 2022-02-21 DIAGNOSIS — N200 Calculus of kidney: Secondary | ICD-10-CM | POA: Insufficient documentation

## 2022-08-06 ENCOUNTER — Ambulatory Visit: Payer: Federal, State, Local not specified - PPO | Admitting: Urology

## 2022-09-11 ENCOUNTER — Other Ambulatory Visit: Payer: Self-pay | Admitting: Family Medicine

## 2022-09-11 DIAGNOSIS — Z1231 Encounter for screening mammogram for malignant neoplasm of breast: Secondary | ICD-10-CM

## 2022-10-08 ENCOUNTER — Ambulatory Visit
Admission: RE | Admit: 2022-10-08 | Discharge: 2022-10-08 | Disposition: A | Discharge status: Home / Self Care | Payer: Federal, State, Local not specified - PPO | Source: Ambulatory Visit | Attending: Family Medicine | Admitting: Family Medicine

## 2022-10-08 DIAGNOSIS — Z1231 Encounter for screening mammogram for malignant neoplasm of breast: Secondary | ICD-10-CM | POA: Insufficient documentation

## 2022-11-26 ENCOUNTER — Telehealth: Payer: Self-pay

## 2022-11-26 NOTE — Telephone Encounter (Signed)
Pt saw Dr Markham Jordan in the past, he is retired and she would like to see Dr Servando Snare as her husband is having his colonoscopy with Dr Servando Snare in the next week...  He was told that pt would need cancel upcoming appt with [facility] and have referral forwarded to [facility]  Okay to schedule procedure

## 2022-12-05 ENCOUNTER — Telehealth: Payer: Self-pay | Admitting: *Deleted

## 2022-12-05 ENCOUNTER — Other Ambulatory Visit: Payer: Self-pay | Admitting: *Deleted

## 2022-12-05 DIAGNOSIS — Z860100 Personal history of colon polyps, unspecified: Secondary | ICD-10-CM

## 2022-12-05 MED ORDER — NA SULFATE-K SULFATE-MG SULF 17.5-3.13-1.6 GM/177ML PO SOLN: 1.0000 | Freq: Once | ORAL | 0 refills | Status: AC

## 2022-12-05 NOTE — Telephone Encounter (Signed)
Gastroenterology Pre-Procedure Review  Request Date: 12/27/2022 Requesting Physician: Dr. Servando Snare  PATIENT REVIEW QUESTIONS: The patient responded to the following health history questions as indicated:    1. Are you having any GI issues? no 2. Do you have a personal history of Polyps? yes (last colonoscopy with Dr Mechele Collin) 3. Do you have a family history of Colon Cancer or Polyps? no 4. Diabetes Mellitus? no 5. Joint replacements in the past 12 months?no 6. Major health problems in the past 3 months?no 7. Any artificial heart valves, MVP, or defibrillator?no    MEDICATIONS & ALLERGIES:    Patient reports the following regarding taking any anticoagulation/antiplatelet therapy:   Plavix, Coumadin, Eliquis, Xarelto, Lovenox, Pradaxa, Brilinta, or Effient? no Aspirin? yes (81 mg)  Patient confirms/reports the following medications:  Current Outpatient Medications  Medication Sig Dispense Refill   aspirin EC 81 MG tablet Take 81 mg by mouth 2 (two) times daily.     calcium carbonate (OS-CAL) 600 MG TABS tablet Take 1,200 mg by mouth daily.      Cholecalciferol 1000 units tablet Take 1,000 Units by mouth daily.     estradiol (ESTRACE) 0.1 MG/GM vaginal cream Estrogen Cream Instruction Discard applicator Apply pea sized amount to tip of finger to urethra before bed. Wash hands well after application. Use Monday, Wednesday and Friday 42.5 g 12   EVENITY 105 MG/1. SOSY injection      Liraglutide -Weight Management (SAXENDA) 18 MG/3ML SOPN Inject 3 mg into the skin.     nitrofurantoin, macrocrystal-monohydrate, (MACROBID) 100 MG capsule Take 1 capsule (100 mg total) by mouth daily. 90 capsule 0   No current facility-administered medications for this visit.    Patient confirms/reports the following allergies:  Allergies  Allergen Reactions   Morphine And Codeine Itching    No orders of the defined types were placed in this encounter.   AUTHORIZATION INFORMATION Primary  Insurance: 1D#: Group #:  Secondary Insurance: 1D#: Group #:  SCHEDULE INFORMATION: Date: 12/27/2022 Time: Location:  [facility]

## 2022-12-05 NOTE — Telephone Encounter (Signed)
Patient's referral was scan in on 12/04/2022. Called patient and was able to schedule patient on 12/27/2022 with Dr Servando Snare at [facility]

## 2022-12-12 ENCOUNTER — Encounter: Payer: Self-pay | Admitting: Gastroenterology

## 2022-12-27 ENCOUNTER — Encounter
Admission: RE | Disposition: A | Discharge status: Home / Self Care | Payer: Self-pay | Source: Home / Self Care | Attending: Gastroenterology

## 2022-12-27 ENCOUNTER — Ambulatory Visit: Payer: Federal, State, Local not specified - PPO | Admitting: Anesthesiology

## 2022-12-27 ENCOUNTER — Encounter: Payer: Self-pay | Admitting: Gastroenterology

## 2022-12-27 ENCOUNTER — Ambulatory Visit
Admission: RE | Admit: 2022-12-27 | Discharge: 2022-12-27 | Disposition: A | Discharge status: Home / Self Care | Payer: Federal, State, Local not specified - PPO | Attending: Gastroenterology | Admitting: Gastroenterology

## 2022-12-27 ENCOUNTER — Other Ambulatory Visit: Payer: Self-pay

## 2022-12-27 DIAGNOSIS — Z1211 Encounter for screening for malignant neoplasm of colon: Secondary | ICD-10-CM | POA: Insufficient documentation

## 2022-12-27 DIAGNOSIS — Z7985 Long-term (current) use of injectable non-insulin antidiabetic drugs: Secondary | ICD-10-CM | POA: Insufficient documentation

## 2022-12-27 DIAGNOSIS — K640 First degree hemorrhoids: Secondary | ICD-10-CM | POA: Insufficient documentation

## 2022-12-27 DIAGNOSIS — Z860100 Personal history of colon polyps, unspecified: Secondary | ICD-10-CM

## 2022-12-27 HISTORY — PX: COLONOSCOPY WITH PROPOFOL: SHX5780

## 2022-12-27 HISTORY — DX: Other specified postprocedural states: Z98.890

## 2022-12-27 SURGERY — COLONOSCOPY WITH PROPOFOL
Anesthesia: General | Site: Rectum

## 2022-12-27 MED ORDER — STERILE WATER FOR IRRIGATION IR SOLN
Status: DC | PRN
  Administered 2022-12-27: 1

## 2022-12-27 MED ORDER — LACTATED RINGERS IV SOLN: INTRAVENOUS | Status: DC

## 2022-12-27 MED ORDER — PROPOFOL 10 MG/ML IV BOLUS
INTRAVENOUS | Status: DC | PRN
  Administered 2022-12-27: 100 mg via INTRAVENOUS
  Administered 2022-12-27 (×2): 50 mg via INTRAVENOUS

## 2022-12-27 SURGICAL SUPPLY — 18 items
CLIP HMST 235XBRD CATH ROT (MISCELLANEOUS) IMPLANT
ELECT REM PT RETURN 9FT ADLT (ELECTROSURGICAL)
ELECTRODE REM PT RTRN 9FT ADLT (ELECTROSURGICAL) IMPLANT
FORCEPS BIOP RAD 4 LRG CAP 4 (CUTTING FORCEPS) IMPLANT
GOWN CVR UNV OPN BCK APRN NK (MISCELLANEOUS) ×2 IMPLANT
INJECTOR VARIJECT VIN23 (MISCELLANEOUS) IMPLANT
KIT DEFENDO VALVE AND CONN (KITS) IMPLANT
KIT PRC NS LF DISP ENDO (KITS) ×1 IMPLANT
MANIFOLD NEPTUNE II (INSTRUMENTS) ×1 IMPLANT
MARKER SPOT ENDO TATTOO 5ML (MISCELLANEOUS) IMPLANT
PROBE APC STR FIRE (PROBE) IMPLANT
RETRIEVER NET ROTH 2.5X230 LF (MISCELLANEOUS) IMPLANT
SNARE COLD EXACTO (MISCELLANEOUS) IMPLANT
SNARE SHORT THROW 13M SML OVAL (MISCELLANEOUS) IMPLANT
SNARE SNG USE RND 15MM (INSTRUMENTS) IMPLANT
TRAP ETRAP POLY (MISCELLANEOUS) IMPLANT
VARIJECT INJECTOR VIN23 (MISCELLANEOUS)
WATER STERILE IRR 250ML POUR (IV SOLUTION) ×1 IMPLANT

## 2022-12-27 NOTE — Anesthesia Postprocedure Evaluation (Signed)
Anesthesia Post Note  Patient: SHALEI NEYLON  Procedure(s) Performed: COLONOSCOPY WITH PROPOFOL (Rectum)  Patient location during evaluation: PACU Anesthesia Type: General Level of consciousness: awake and alert Pain management: pain level controlled Vital Signs Assessment: post-procedure vital signs reviewed and stable Respiratory status: spontaneous breathing, nonlabored ventilation, respiratory function stable and patient connected to nasal cannula oxygen Cardiovascular status: blood pressure returned to baseline and stable Postop Assessment: no apparent nausea or vomiting Anesthetic complications: no   No notable events documented.   Last Vitals:  Vitals:   12/27/22 0855 12/27/22 0900  BP:  (!) 88/60  Pulse: (!) 54 (!) 55  Resp: 16 16  Temp:  36.8 C  SpO2: 95% 96%    Last Pain:  Vitals:   12/27/22 0900  TempSrc:   PainSc: 0-No pain                 Cleda Mccreedy Brylee Berk

## 2022-12-27 NOTE — Transfer of Care (Signed)
Immediate Anesthesia Transfer of Care Note  Patient: Carol [name]  Procedure(s) Performed: COLONOSCOPY WITH PROPOFOL (Rectum)  Patient Location: PACU  Anesthesia Type: General  Level of Consciousness: awake, alert  and patient cooperative  Airway and Oxygen Therapy: Patient Spontanous Breathing and Patient connected to supplemental oxygen  Post-op Assessment: Post-op Vital signs reviewed, Patient's Cardiovascular Status Stable, Respiratory Function Stable, Patent Airway and No signs of Nausea or vomiting  Post-op Vital Signs: Reviewed and stable  Complications: No notable events documented.

## 2022-12-27 NOTE — H&P (Signed)
Midge Minium, MD [facility] [address] [place], [place] [address] [phone] Fax : [phone]  Primary Care Physician:  Langston Reusing, [place] Primary Gastroenterologist:  Dr. Servando Snare  Pre-Procedure History & Physical: HPI:  Carol [name] is a 57 y.o. female is here for an colonoscopy.   Past Medical History:  Diagnosis Date   Hirschsprung's disease    History of kidney stones    Neuromuscular disorder ([facility])    Osteoporosis    PONV (postoperative nausea and vomiting)     Past Surgical History:  Procedure Laterality Date   COLONOSCOPY WITH PROPOFOL N/A 05/26/2015   Procedure: COLONOSCOPY WITH PROPOFOL;  Surgeon: Scot Jun, MD;  Location: [facility];  Service: Endoscopy;  Laterality: N/A;   CYSTOSCOPY W/ RETROGRADES Left 11/26/2018   Procedure: CYSTOSCOPY WITH RETROGRADE PYELOGRAM;  Surgeon: Orson Ape, MD;  Location: [facility];  Service: Urology;  Laterality: Left;   CYSTOSCOPY WITH STENT PLACEMENT Left 11/26/2018   Procedure: CYSTOSCOPY WITH STENT PLACEMENT;  Surgeon: Orson Ape, MD;  Location: [facility];  Service: Urology;  Laterality: Left;   FEMUR IM NAIL Right 09/15/2021   Procedure: INTRAMEDULLARY (IM) NAIL FEMORAL;  Surgeon: Sheral Apley, MD;  Location[facility];  Service: Orthopedics;  Laterality: Right;   KNEE SURGERY     MYOMECTOMY     SALPINGECTOMY Left    SKIN GRAFT SPLIT THICKNESS LEG / FOOT Bilateral    URETEROSCOPY WITH HOLMIUM LASER LITHOTRIPSY Left 11/26/2018   Procedure: URETEROSCOPY WITH HOLMIUM LASER LITHOTRIPSY;  Surgeon: Orson Ape, MD;  Location: [facility];  Service: Urology;  Laterality: Left;    Prior to Admission medications   Medication Sig Start Date End Date Taking? Authorizing Provider  calcium carbonate (OS-CAL) 600 MG TABS tablet Take 1,200 mg by mouth daily.    Yes [provider]  Cholecalciferol 1000 units tablet Take 1,000 Units by mouth daily.   Yes [provider]  aspirin EC 81 MG tablet Take 81 mg by mouth 2 (two) times daily. Patient not taking: Reported on 12/27/2022 09/16/21   [provider]  estradiol (ESTRACE) 0.1 MG/GM vaginal cream Estrogen Cream Instruction Discard applicator Apply pea sized amount to tip of finger to urethra before bed. Wash hands well after application. Use Monday, Wednesday and Friday Patient not taking: Reported on 12/12/2022 02/05/22   Sondra Come, MD  EVENITY 105 MG/1. SOSY injection  01/12/22   [provider]  Liraglutide -Weight Management (SAXENDA) 18 MG/3ML SOPN Inject 3 mg into the skin. Patient not taking: Reported on 12/12/2022    [provider]  nitrofurantoin, macrocrystal-monohydrate, (MACROBID) 100 MG capsule Take 1 capsule (100 mg total) by mouth daily. Patient not taking: Reported on 12/12/2022 02/05/22   Sondra Come, MD    Allergies as of 12/05/2022 - Review Complete 02/05/2022  Allergen Reaction Noted   Morphine and codeine Itching 11/28/2014    Family History  Problem Relation Age of Onset   Breast cancer Mother 31    Social History   Socioeconomic History   Marital status: Married    Spouse name: Not on file   Number of children: Not on file   Years of education: Not on file   Highest education level: Not on file  Occupational History   Not on file  Tobacco Use   Smoking status: Never   Smokeless tobacco: Never  Vaping Use   Vaping status: Never Used  Substance and Sexual Activity  Alcohol use: Yes    Comment: 1-2 drinks/monthly   Drug use: No   Sexual activity: Not on file  Other Topics Concern   Not on file  Social History Narrative   Not on file   Social Determinants of Health   Financial Resource Strain: Low Risk  (09/05/2022)   Received from [facility]   Overall Financial Resource Strain (CARDIA)    Difficulty of Paying Living Expenses: Not very hard  Food Insecurity: No Food Insecurity  (09/05/2022)   Received from [facility]   Hunger Vital Sign    Worried About Running Out of Food in the Last Year: Never true    Ran Out of Food in the Last Year: Never true  Transportation Needs: No Transportation Needs (09/05/2022)   Received from [facility] - Transportation    In the past 12 months, has lack of transportation kept you from medical appointments or from getting medications?: No    Lack of Transportation (Non-Medical): No  Physical Activity: Not on file  Stress: Not on file  Social Connections: Unknown (06/05/2021)   Received from [place], [facility]   Social Network    Social Network: Not on file  Intimate Partner Violence: Unknown (04/27/2021)   Received from [facility]   HITS    Physically Hurt: Not on file    Insult or Talk Down To: Not on file    Threaten Physical Harm: Not on file    Scream or Curse: Not on file    Review of Systems: See HPI, otherwise negative ROS  Physical Exam: BP 124/77   Temp (!) 97.5 F (36.4 C) (Temporal)   Resp 13   Ht 5' 7.01" (1.702 m)   Wt 64.5 kg   SpO2 98%   BMI 22.28 kg/m  General:   Alert,  pleasant and cooperative in NAD Head:  Normocephalic and atraumatic. Neck:  Supple; no masses or thyromegaly. Lungs:  Clear throughout to auscultation.    Heart:  Regular rate and rhythm. Abdomen:  Soft, nontender and nondistended. Normal bowel sounds, without guarding, and without rebound.   Neurologic:  Alert and  oriented x4;  grossly normal neurologically.  Impression/Plan: Carol [name] is here for an colonoscopy to be performed for a history of adenomatous polyps on 2017   Risks, benefits, limitations, and alternatives regarding  colonoscopy have been reviewed with the patient.  Questions have been answered.  All parties agreeable.   Midge Minium, MD  12/27/2022, 8:22 AM

## 2022-12-27 NOTE — Anesthesia Preprocedure Evaluation (Signed)
Anesthesia Evaluation  Patient identified by MRN, date of birth, ID band Patient awake    Reviewed: Allergy & Precautions, NPO status , Patient's Chart, lab work & pertinent test results  History of Anesthesia Complications (+) PONV and history of anesthetic complications  Airway Mallampati: III  TM Distance: >3 FB Neck ROM: full    Dental  (+) Chipped   Pulmonary neg pulmonary ROS, neg shortness of breath   Pulmonary exam normal        Cardiovascular (-) angina negative cardio ROS Normal cardiovascular exam     Neuro/Psych  Neuromuscular disease  negative psych ROS   GI/Hepatic negative GI ROS, Neg liver ROS,neg GERD  ,,  Endo/Other  negative endocrine ROS    Renal/GU Renal disease  negative genitourinary   Musculoskeletal   Abdominal   Peds  Hematology negative hematology ROS (+)   Anesthesia Other Findings Past Medical History: No date: Hirschsprung's disease No date: History of kidney stones No date: Neuromuscular disorder ([facility]) No date: Osteoporosis No date: PONV (postoperative nausea and vomiting)  Past Surgical History: 05/26/2015: COLONOSCOPY WITH PROPOFOL; N/A     Comment:  Procedure: COLONOSCOPY WITH PROPOFOL;  Surgeon: Scot Jun, MD;  Location[facility];  Service:               Endoscopy;  Laterality: N/A; 11/26/2018: CYSTOSCOPY W/ RETROGRADES; Left     Comment:  Procedure: CYSTOSCOPY WITH RETROGRADE PYELOGRAM;                Surgeon: Orson Ape, MD;  Location: [facility];                Service: Urology;  Laterality: Left; 11/26/2018: CYSTOSCOPY WITH STENT PLACEMENT; Left     Comment:  Procedure: CYSTOSCOPY WITH STENT PLACEMENT;  Surgeon:               Orson Ape, MD;  Location: [facility];  Service:               Urology;  Laterality: Left; 09/15/2021: FEMUR IM NAIL; Right     Comment:  Procedure: INTRAMEDULLARY (IM) NAIL FEMORAL;  Surgeon:               Sheral Apley, MD;  Location: [facility];  Service:               Orthopedics;  Laterality: Right; No date: KNEE SURGERY No date: MYOMECTOMY No date: SALPINGECTOMY; Left No date: SKIN GRAFT SPLIT THICKNESS LEG / FOOT; Bilateral 11/26/2018: URETEROSCOPY WITH HOLMIUM LASER LITHOTRIPSY; Left     Comment:  Procedure: URETEROSCOPY WITH HOLMIUM LASER LITHOTRIPSY;               Surgeon: Orson Ape, MD;  Location: [facility];                Service: Urology;  Laterality: Left;  BMI    Body Mass Index: 22.28 kg/m      Reproductive/Obstetrics negative OB ROS                             Anesthesia Physical Anesthesia Plan  ASA: 2  Anesthesia Plan: General   Post-op Pain Management:    Induction: Intravenous  PONV Risk Score and Plan: Propofol infusion and TIVA  Airway Management Planned: Natural Airway and Nasal Cannula  Additional Equipment:  Intra-op Plan:   Post-operative Plan:   Informed Consent: I have reviewed the patients History and Physical, chart, labs and discussed the procedure including the risks, benefits and alternatives for the proposed anesthesia with the patient or authorized representative who has indicated his/her understanding and acceptance.     Dental Advisory Given  Plan Discussed with: Anesthesiologist, CRNA and Surgeon  Anesthesia Plan Comments: (Patient consented for risks of anesthesia including but not limited to:  - adverse reactions to medications - risk of airway placement if required - damage to eyes, teeth, lips or other oral mucosa - nerve damage due to positioning  - sore throat or hoarseness - Damage to heart, brain, nerves, lungs, other parts of body or loss of life  Patient voiced understanding and assent.)       Anesthesia Quick Evaluation

## 2022-12-27 NOTE — Op Note (Signed)
[facility] Gastroenterology Patient Name: Carol [name] Procedure Date: 12/27/2022 8:24 AM MRN: [id number] Account #: [id number] Date of Birth: 23-Jul-1965 Admit Type: Outpatient Age: 57 Room: [facility] OR ROOM 01 Gender: Female Note Status: Finalized Instrument Name: [id number] Procedure:             Colonoscopy Indications:           High risk colon cancer surveillance: Personal history                         of colonic polyps Providers:             Midge Minium MD, MD Medicines:             Propofol per Anesthesia Complications:         No immediate complications. Procedure:             Pre-Anesthesia Assessment:                        - Prior to the procedure, a History and Physical was                         performed, and patient medications and allergies were                         reviewed. The patient's tolerance of previous                         anesthesia was also reviewed. The risks and benefits                         of the procedure and the sedation options and risks                         were discussed with the patient. All questions were                         answered, and informed consent was obtained. Prior                         Anticoagulants: The patient has taken no anticoagulant                         or antiplatelet agents. ASA Grade Assessment: II - A                         patient with mild systemic disease. After reviewing                         the risks and benefits, the patient was deemed in                         satisfactory condition to undergo the procedure.                        After obtaining informed consent, the colonoscope was                         passed under direct vision. Throughout the  procedure,                         the patient's blood pressure, pulse, and oxygen                         saturations were monitored continuously. The                         Colonoscope was introduced through the anus and                          advanced to the the cecum, identified by appendiceal                         orifice and ileocecal valve. The colonoscopy was                         performed without difficulty. The patient tolerated                         the procedure well. The quality of the bowel                         preparation was excellent. Findings:      The perianal and digital rectal examinations were normal.      Non-bleeding internal hemorrhoids were found during retroflexion. The       hemorrhoids were Grade I (internal hemorrhoids that do not prolapse). Impression:            - Non-bleeding internal hemorrhoids.                        - No specimens collected. Recommendation:        - Discharge patient to home.                        - Resume previous diet.                        - Continue present medications.                        - Repeat colonoscopy in 7 years for surveillance. Procedure Code(s):     --- Professional ---                        [phone], Colonoscopy, flexible; diagnostic, including                         collection of specimen(s) by brushing or washing, when                         performed (separate procedure) Diagnosis Code(s):     --- Professional ---                        Z86.010, Personal history of colonic polyps CPT copyright 2022 American Medical Association. All rights reserved. The codes documented in this report are preliminary and upon coder review may  be revised to meet current compliance requirements. Midge Minium MD, MD 12/27/2022 8:49:20 AM  This report has been signed electronically. Number of Addenda: 0 Note Initiated On: 12/27/2022 8:24 AM Scope Withdrawal Time: 0 hours 6 minutes 53 seconds  Total Procedure Duration: 0 hours 9 minutes 2 seconds  Estimated Blood Loss:  Estimated blood loss: none.      [facility]

## 2022-12-28 ENCOUNTER — Encounter: Payer: Self-pay | Admitting: Gastroenterology

## 2023-12-20 ENCOUNTER — Other Ambulatory Visit: Payer: Self-pay

## 2023-12-20 ENCOUNTER — Ambulatory Visit
Admission: EM | Admit: 2023-12-20 | Discharge: 2023-12-20 | Disposition: A | Discharge status: Home / Self Care | Attending: Nurse Practitioner | Admitting: Nurse Practitioner

## 2023-12-20 DIAGNOSIS — R350 Frequency of micturition: Secondary | ICD-10-CM | POA: Diagnosis present

## 2023-12-20 DIAGNOSIS — N3001 Acute cystitis with hematuria: Secondary | ICD-10-CM | POA: Insufficient documentation

## 2023-12-20 LAB — POCT URINE DIPSTICK
Bilirubin, UA: NEGATIVE
Glucose, UA: NEGATIVE mg/dL
Ketones, POC UA: NEGATIVE mg/dL
Nitrite, UA: NEGATIVE
POC PROTEIN,UA: 30 — AB
Spec Grav, UA: 1.02 (ref 1.010–1.025)
Urobilinogen, UA: 0.2 U/dL
pH, UA: 5.5 (ref 5.0–8.0)

## 2023-12-20 MED ORDER — FLUCONAZOLE 150 MG PO TABS
150.0000 mg | ORAL_TABLET | ORAL | 0 refills | Status: AC
Start: 2023-12-20 — End: 2023-12-24

## 2023-12-20 MED ORDER — NITROFURANTOIN MONOHYD MACRO 100 MG PO CAPS: 100.0000 mg | ORAL_CAPSULE | Freq: Two times a day (BID) | ORAL | 0 refills | Status: AC

## 2023-12-20 NOTE — Discharge Instructions (Addendum)
 You were seen today for symptoms consistent with a urinary tract infection (UTI). You have been prescribed Macrobid  to treat the infection. Take the antibiotics exactly as prescribed and complete the full course, even if you start feeling better. A urine culture has been sent to identify the specific bacteria causing the infection and to confirm that the prescribed antibiotic is appropriate. You will only be contacted if your results are abnormal; otherwise, you may review them in your MyChart account.   It is important to stay well hydrated by drinking plenty of fluids throughout the day. This helps flush out your urinary system and keeps your urine light yellow, which is a sign of good hydration. Avoid caffeine and alcohol, as they can irritate the bladder. Be sure to urinate regularly and empty your bladder fully. Do not hold your urine for extended periods. Always wipe from front to back after using the bathroom and use a clean tissue for each wipe. It is also important to urinate after sexual activity. Avoid douching or using sprays or powders in the genital area, as these can cause irritation. Follow up with your healthcare provider if your symptoms do not improve within a few days, get worse, or return after completing your treatment.

## 2023-12-20 NOTE — ED Triage Notes (Signed)
 Pt being seen in UC for urinary urgency, urinary frequency, and odor. Pt reports s/s approximately 3 days ago. Pt denies fever, n/v.  Pt denies otc medication.

## 2023-12-20 NOTE — ED Provider Notes (Signed)
 Carol [name]    CSN: [id number] Arrival date & time: 12/20/23  9192      History   Chief Complaint Chief Complaint  Patient presents with   Urinary Frequency    HPI Carol [name] is a 58 y.o. female.   Discussed the use of AI scribe software for clinical note transcription with the patient, who gave verbal consent to proceed.   The patient is a female presenting with urinary frequency and blood in her urine that began yesterday. She experienced urgency to urinate and noticed blood in her urine on two occasions yesterday. Initially, she thought it might have been related to consuming beet smoothies, but approximately 20 minutes later when she returned to the bathroom, she observed clots in her urine. She describes having blood in her urine consistently for 5-6 hours every time she urinated yesterday, which seemed like being on a period. This morning, the bleeding appeared somewhat lessened, but when she checked again, it returned to what she describes as regular. The symptoms came on suddenly out of nowhere, which caused her to panic. She denies any pain or discomfort with urination, abdominal pain, itching, irritation, fevers, chills, or body aches. She continues to have regular menstrual cycles, with her last period ending 7 days ago and having started 4 days prior to that.  The following sections of the patient's history were reviewed and updated as appropriate: allergies, current medications, past family history, past medical history, past social history, past surgical history, and problem list.       Past Medical History:  Diagnosis Date   Hirschsprung's disease (HCC)    History of kidney stones    Neuromuscular disorder (HCC)    Osteoporosis    PONV (postoperative nausea and vomiting)     Patient Active Problem List   Diagnosis Date Noted   Hx of colonic polyps 12/27/2022   Nephrolithiasis 02/18/2022   Acute anemia 09/15/2021   Closed femur  fracture (HCC) 09/14/2021    Past Surgical History:  Procedure Laterality Date   COLONOSCOPY WITH PROPOFOL  N/A 05/26/2015   Procedure: COLONOSCOPY WITH PROPOFOL ;  Surgeon: Lamar ONEIDA Holmes, MD;  Location: [facility] ENDOSCOPY;  Service: Endoscopy;  Laterality: N/A;   COLONOSCOPY WITH PROPOFOL  N/A 12/27/2022   Procedure: COLONOSCOPY WITH PROPOFOL ;  Surgeon: Jinny Carmine, MD;  Location: [facility];  Service: Endoscopy;  Laterality: N/A;   CYSTOSCOPY W/ RETROGRADES Left 11/26/2018   Procedure: CYSTOSCOPY WITH RETROGRADE PYELOGRAM;  Surgeon: Kassie Ozell SAUNDERS, MD;  Location: [facility] ORS;  Service: Urology;  Laterality: Left;   CYSTOSCOPY WITH STENT PLACEMENT Left 11/26/2018   Procedure: CYSTOSCOPY WITH STENT PLACEMENT;  Surgeon: Kassie Ozell SAUNDERS, MD;  Location: [facility] ORS;  Service: Urology;  Laterality: Left;   FEMUR IM NAIL Right 09/15/2021   Procedure: INTRAMEDULLARY (IM) NAIL FEMORAL;  Surgeon: Beverley Evalene BIRCH, MD;  Location: [facility] OR;  Service: Orthopedics;  Laterality: Right;   KNEE SURGERY     MYOMECTOMY     SALPINGECTOMY Left    SKIN GRAFT SPLIT THICKNESS LEG / FOOT Bilateral    URETEROSCOPY WITH HOLMIUM LASER LITHOTRIPSY Left 11/26/2018   Procedure: URETEROSCOPY WITH HOLMIUM LASER LITHOTRIPSY;  Surgeon: Kassie Ozell SAUNDERS, MD;  Location: [facility] ORS;  Service: Urology;  Laterality: Left;    OB History   No obstetric history on file.      Home Medications    Prior to Admission medications   Medication Sig Start Date End Date Taking? Authorizing Provider  fluconazole (DIFLUCAN) 150 MG  tablet Take 1 tablet (150 mg total) by mouth every 3 (three) days for 2 doses. 12/20/23 12/24/23 Yes Iola Lukes, FNP  nitrofurantoin , macrocrystal-monohydrate, (MACROBID ) 100 MG capsule Take 1 capsule (100 mg total) by mouth 2 (two) times daily. 12/20/23  Yes Iola Lukes, FNP  aspirin  EC 81 MG tablet Take 81 mg by mouth 2 (two) times daily. Patient not taking: Reported on 12/27/2022 09/16/21   [provider]  calcium  carbonate (OS-CAL) 600 MG TABS tablet Take 1,200 mg by mouth daily.     [provider]  Cholecalciferol  1000 units tablet Take 1,000 Units by mouth daily.    [provider]  estradiol  (ESTRACE ) 0.1 MG/GM vaginal cream Estrogen Cream Instruction Discard applicator Apply pea sized amount to tip of finger to urethra before bed. Wash hands well after application. Use Monday, Wednesday and Friday Patient not taking: Reported on 12/12/2022 02/05/22   Francisca Redell BROCKS, MD  EVENITY 105 MG/1. SOSY injection  01/12/22   [provider]  Liraglutide -Weight Management (SAXENDA) 18 MG/3ML SOPN Inject 3 mg into the skin. Patient not taking: Reported on 12/12/2022    [provider]    Family History Family History  Problem Relation Age of Onset   Breast cancer Mother 7    Social History Social History   Tobacco Use   Smoking status: Never   Smokeless tobacco: Never  Vaping Use   Vaping status: Never Used  Substance Use Topics   Alcohol use: Yes    Comment: 1-2 drinks/monthly   Drug use: No     Allergies   Morphine  and codeine   Review of Systems Review of Systems  Constitutional:  Negative for fever.  Gastrointestinal:  Negative for abdominal pain, nausea and vomiting.  Genitourinary:  Positive for frequency and urgency. Negative for dysuria, menstrual problem (postmenopausal) and vaginal discharge.       Urinary odor. No irritation. Starting to have some itching this morning.   Musculoskeletal:  Negative for back pain.  All other systems reviewed and are negative.    Physical Exam Triage Vital Signs ED Triage Vitals  Encounter Vitals Group     BP 12/20/23 0818 130/70     Girls Systolic BP Percentile --      Girls Diastolic BP Percentile --      Boys Systolic BP Percentile --      Boys Diastolic BP Percentile --      Pulse Rate 12/20/23 0818 61     Resp 12/20/23 0818 18     Temp 12/20/23 0818 97.7 F (36.5  C)     Temp src --      SpO2 12/20/23 0818 98 %     Weight --      Height --      Head Circumference --      Peak Flow --      Pain Score 12/20/23 0817 0     Pain Loc --      Pain Education --      Exclude from Growth Chart --    No data found.  Updated Vital Signs BP 130/70   Pulse 61   Temp 97.7 F (36.5 C)   Resp 18   SpO2 98%   Visual Acuity Right Eye Distance:   Left Eye Distance:   Bilateral Distance:    Right Eye Near:   Left Eye Near:    Bilateral Near:     Physical Exam Vitals reviewed.  Constitutional:  General: She is awake. She is not in acute distress.    Appearance: Normal appearance. She is well-developed. She is not ill-appearing, toxic-appearing or diaphoretic.  HENT:     Head: Normocephalic.     Right Ear: Hearing normal.     Left Ear: Hearing normal.     Nose: Nose normal.     Mouth/Throat:     Mouth: Mucous membranes are moist.  Eyes:     General: Vision grossly intact.     Conjunctiva/sclera: Conjunctivae normal.  Cardiovascular:     Rate and Rhythm: Normal rate and regular rhythm.     Heart sounds: Normal heart sounds.  Pulmonary:     Effort: Pulmonary effort is normal.     Breath sounds: Normal breath sounds and air entry.  Musculoskeletal:        General: Normal range of motion.     Cervical back: Normal range of motion and neck supple.  Skin:    General: Skin is warm and dry.  Neurological:     General: No focal deficit present.     Mental Status: She is alert and oriented to person, place, and time.  Psychiatric:        Speech: Speech normal.        Behavior: Behavior is cooperative.      UC Treatments / Results  Labs (all labs ordered are listed, but only abnormal results are displayed) Labs Reviewed  POCT URINE DIPSTICK - Abnormal; Notable for the following components:      Result Value   Clarity, UA cloudy (*)    Blood, UA small (*)    POC PROTEIN,UA =30 (*)    Leukocytes, UA Moderate (2+) (*)    All other  components within normal limits  URINE CULTURE    EKG   Radiology No results found.  Procedures Procedures (including critical care time)  Medications Ordered in UC Medications - No data to display  Initial Impression / Assessment and Plan / UC Course  I have reviewed the triage vital signs and the nursing notes.  Pertinent labs & imaging results that were available during my care of the patient were reviewed by me and considered in my medical decision making (see chart for details).     Patient presents with symptoms consistent with a urinary tract infection. Urinalysis reveals cloudy urine with small hematuria and moderate leukocyte esterase, supporting the diagnosis. Nitrofurantoin  was prescribed to be taken twice daily for 5 days. Diflucan also prescribed empirically due to some reported itching that started this morning. A urine culture was sent to identify the causative organism and assess antibiotic sensitivity. Patient was advised that they will be contacted only if the culture results require a change in treatment; otherwise, results can be reviewed via MyChart. Patient instructed to increase fluid intake and monitor symptoms. Follow up with primary care provider if symptoms do not improve or worsen. Emergency evaluation is warranted for fever, back or flank pain, nausea, vomiting, or signs of systemic illness.  Today's evaluation has revealed no signs of a dangerous process. Discussed diagnosis with patient and/or guardian. Patient and/or guardian aware of their diagnosis, possible red flag symptoms to watch out for and need for close follow up. Patient and/or guardian understands verbal and written discharge instructions. Patient and/or guardian comfortable with plan and disposition.  Patient and/or guardian has a clear mental status at this time, good insight into illness (after discussion and teaching) and has clear judgment to make decisions regarding their  care  Documentation was completed with the aid of voice recognition software. Transcription may contain typographical errors.   Final Clinical Impressions(s) / UC Diagnoses   Final diagnoses:  Urinary frequency  Acute cystitis with hematuria     Discharge Instructions      You were seen today for symptoms consistent with a urinary tract infection (UTI). You have been prescribed Macrobid  to treat the infection. Take the antibiotics exactly as prescribed and complete the full course, even if you start feeling better. A urine culture has been sent to identify the specific bacteria causing the infection and to confirm that the prescribed antibiotic is appropriate. You will only be contacted if your results are abnormal; otherwise, you may review them in your MyChart account.   It is important to stay well hydrated by drinking plenty of fluids throughout the day. This helps flush out your urinary system and keeps your urine light yellow, which is a sign of good hydration. Avoid caffeine and alcohol, as they can irritate the bladder. Be sure to urinate regularly and empty your bladder fully. Do not hold your urine for extended periods. Always wipe from front to back after using the bathroom and use a clean tissue for each wipe. It is also important to urinate after sexual activity. Avoid douching or using sprays or powders in the genital area, as these can cause irritation. Follow up with your healthcare provider if your symptoms do not improve within a few days, get worse, or return after completing your treatment.      ED Prescriptions     Medication Sig Dispense Auth. Provider   nitrofurantoin , macrocrystal-monohydrate, (MACROBID ) 100 MG capsule Take 1 capsule (100 mg total) by mouth 2 (two) times daily. 10 capsule Iola Lukes, FNP   fluconazole (DIFLUCAN) 150 MG tablet Take 1 tablet (150 mg total) by mouth every 3 (three) days for 2 doses. 2 tablet Iola Lukes, FNP      PDMP  not reviewed this encounter.   Iola Lukes, [place] 12/20/23 1104

## 2023-12-22 ENCOUNTER — Ambulatory Visit (HOSPITAL_COMMUNITY): Payer: Self-pay

## 2023-12-22 LAB — URINE CULTURE: Culture: >=100000 — AB

## 2024-08-20 IMAGING — RF Imaging study
1 series · 6 of 6 positions shown · non-contrast
Comparison: none

EXAM: CT HEAD WITHOUT CONTRAST
ORD.PHYS: LOCKLEAR, SAVIO

REASON: N/V DIZZINESS
There is mild age appropriate cerebral atrophy. The ventricles are
normal in size and position. I see no evidence of a mass nor of mass
effect. There is no evidence of acute intracranial hemorrhage. The
cerebellum and brain stem are normal in appearance.

[Series 1: run · 6 of 6 slices shown]
[im 1/6]
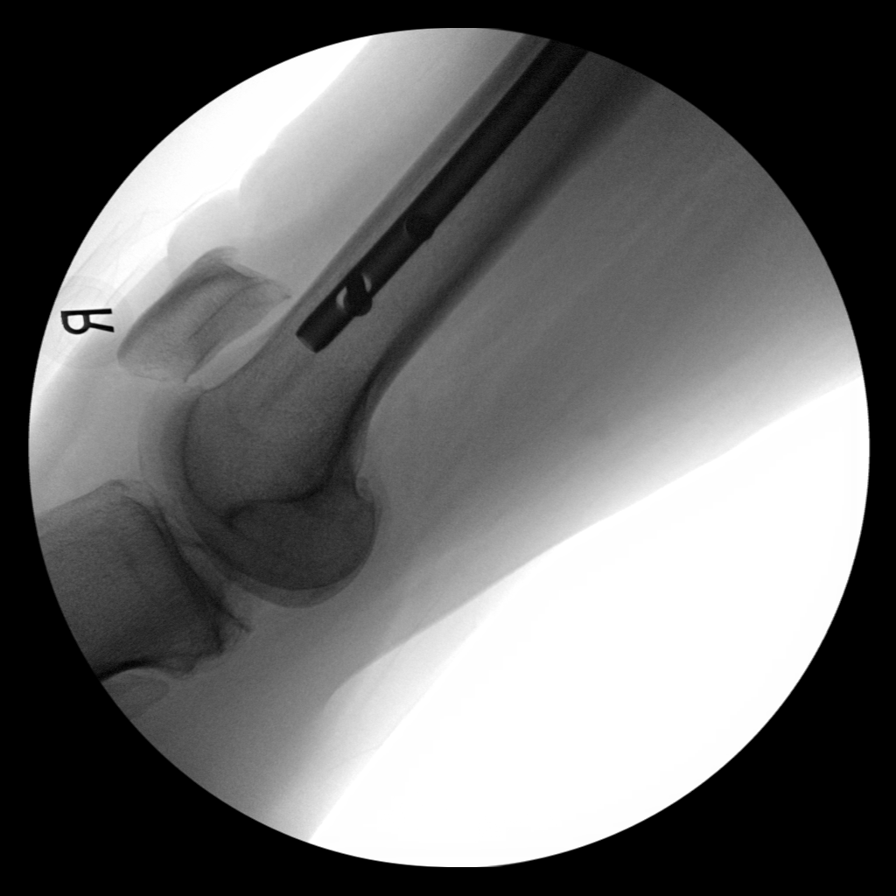
[im 2/6]
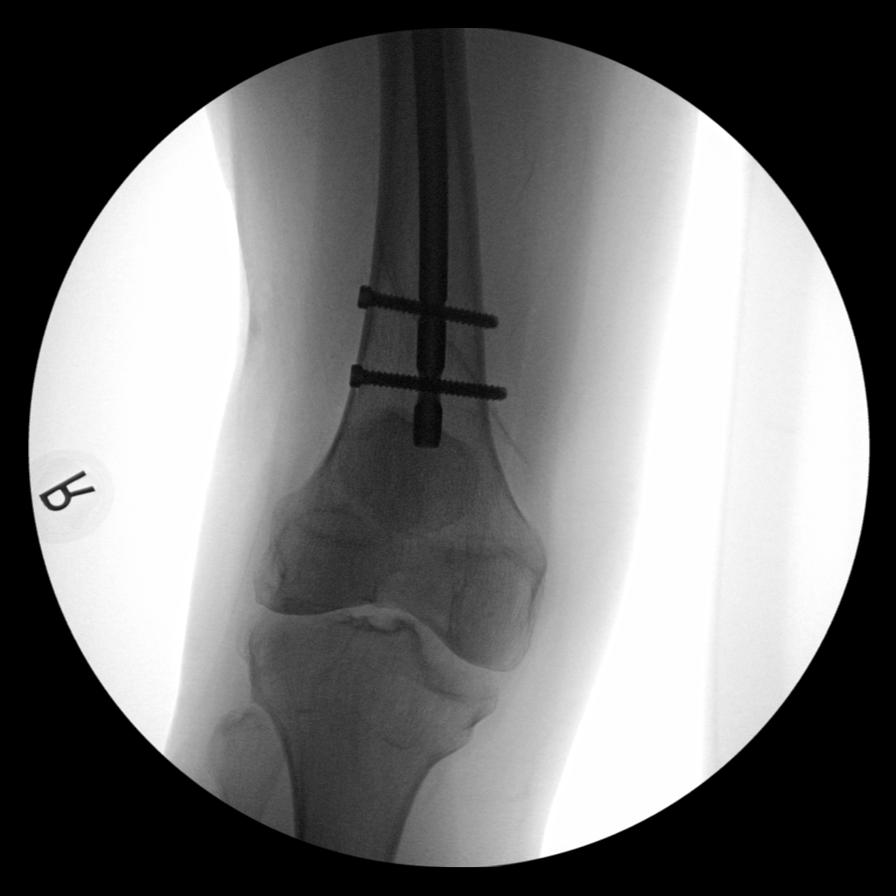
[im 3/6]
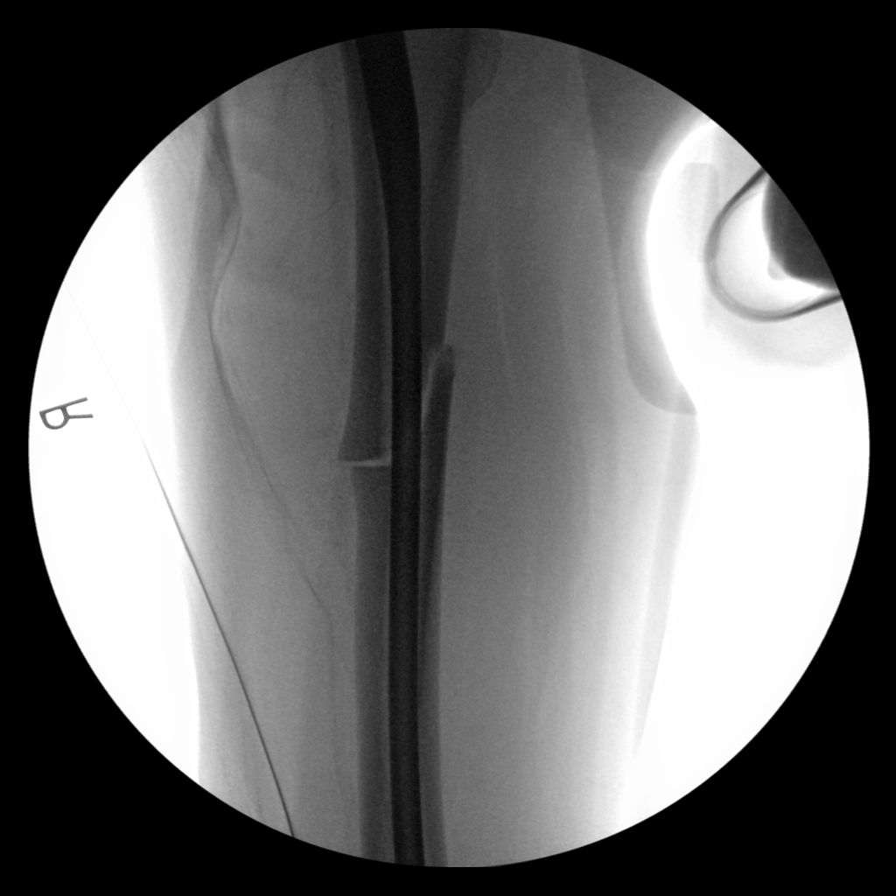
[im 4/6]
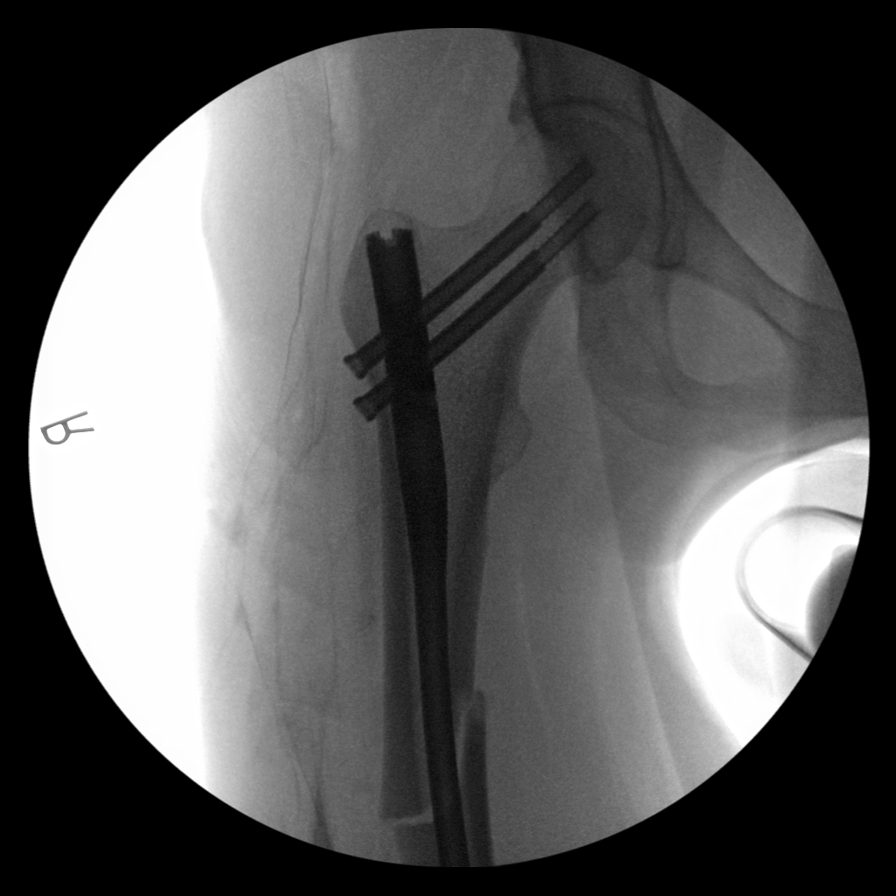
[im 5/6]
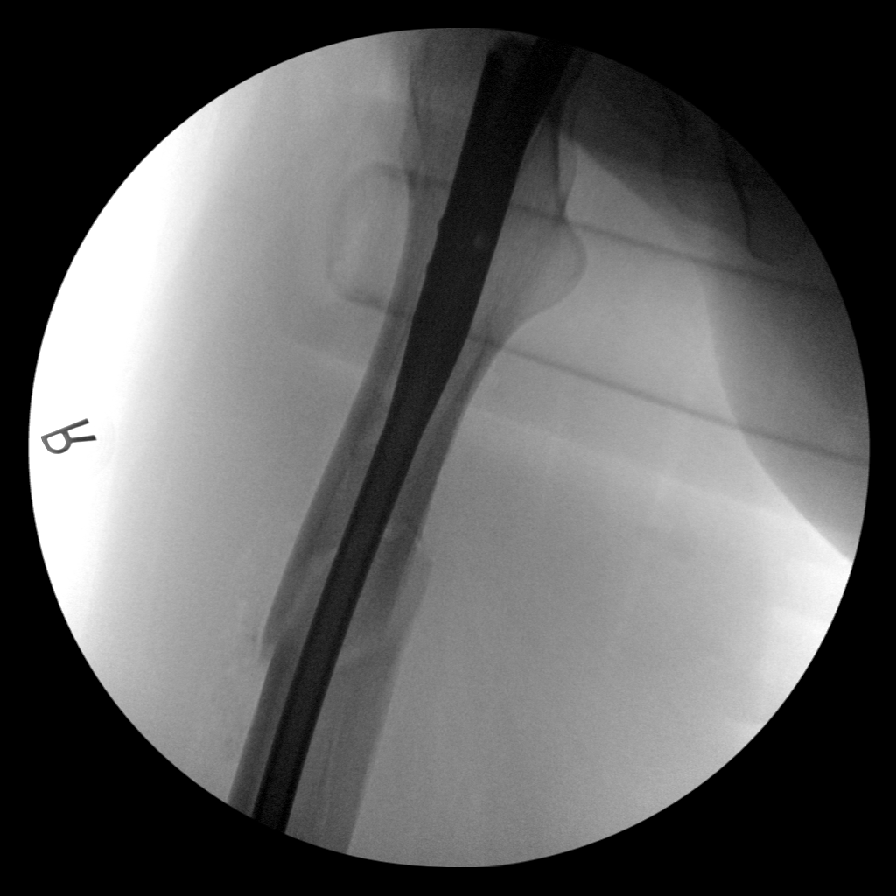
[im 6/6]
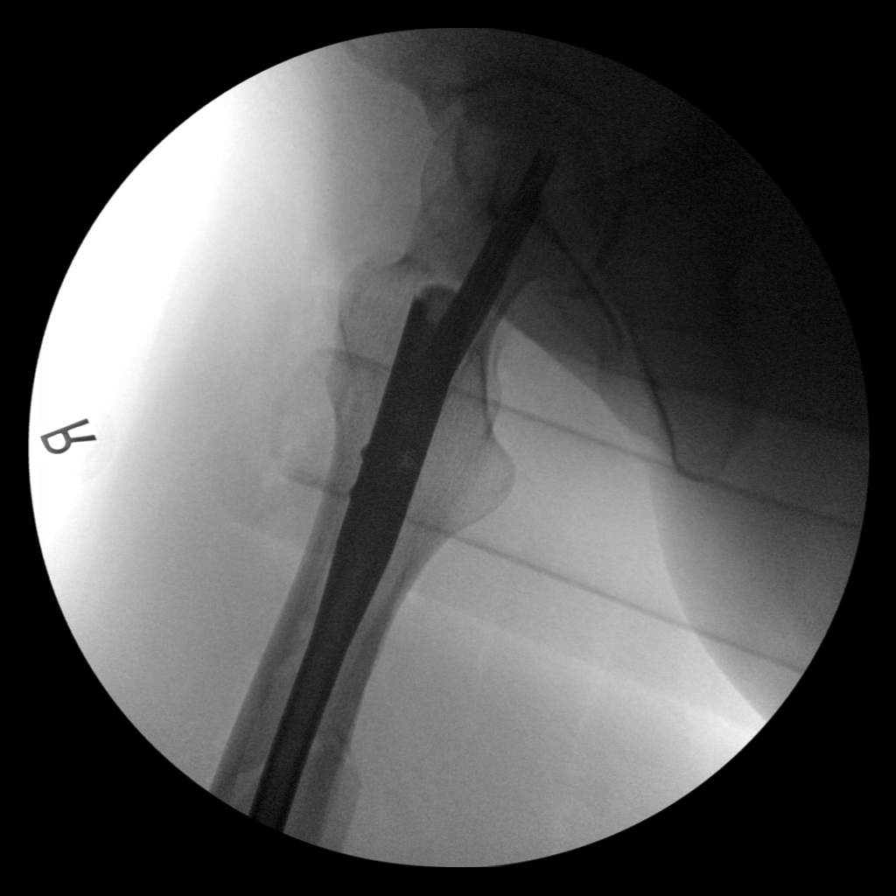

[6 of 6 positions shown; findings below may reference images not displayed]

IMPRESSION: I see no acute intracranial abnormality.

NOTE: The findings were called to Dr. Strom in the Emergency Room
at the conclusion of the study.
